# Patient Record
Sex: Female | Born: 1975 | Race: White | Hispanic: No | Smoking: Former smoker
Health system: Southern US, Community
[De-identification: ages and names within clinical notes are randomized; demographics above are authoritative.]

## PROBLEM LIST (undated history)

## (undated) DIAGNOSIS — K5900 Constipation, unspecified: Secondary | ICD-10-CM

## (undated) DIAGNOSIS — M1990 Unspecified osteoarthritis, unspecified site: Secondary | ICD-10-CM

## (undated) DIAGNOSIS — I10 Essential (primary) hypertension: Secondary | ICD-10-CM

## (undated) DIAGNOSIS — N946 Dysmenorrhea, unspecified: Secondary | ICD-10-CM

## (undated) DIAGNOSIS — R350 Frequency of micturition: Secondary | ICD-10-CM

## (undated) DIAGNOSIS — R8789 Other abnormal findings in specimens from female genital organs: Principal | ICD-10-CM

## (undated) DIAGNOSIS — A630 Anogenital (venereal) warts: Secondary | ICD-10-CM

## (undated) DIAGNOSIS — N920 Excessive and frequent menstruation with regular cycle: Secondary | ICD-10-CM

## (undated) DIAGNOSIS — L739 Follicular disorder, unspecified: Secondary | ICD-10-CM

## (undated) DIAGNOSIS — N760 Acute vaginitis: Secondary | ICD-10-CM

## (undated) DIAGNOSIS — G43109 Migraine with aura, not intractable, without status migrainosus: Secondary | ICD-10-CM

## (undated) DIAGNOSIS — G43909 Migraine, unspecified, not intractable, without status migrainosus: Secondary | ICD-10-CM

## (undated) DIAGNOSIS — B9689 Other specified bacterial agents as the cause of diseases classified elsewhere: Secondary | ICD-10-CM

## (undated) DIAGNOSIS — N898 Other specified noninflammatory disorders of vagina: Secondary | ICD-10-CM

## (undated) HISTORY — DX: Dysmenorrhea, unspecified: N94.6

## (undated) HISTORY — DX: Follicular disorder, unspecified: L73.9

## (undated) HISTORY — DX: Acute vaginitis: N76.0

## (undated) HISTORY — DX: Migraine with aura, not intractable, without status migrainosus: G43.109

## (undated) HISTORY — DX: Essential (primary) hypertension: I10

## (undated) HISTORY — DX: Excessive and frequent menstruation with regular cycle: N92.0

## (undated) HISTORY — PX: TUBAL LIGATION: SHX77

## (undated) HISTORY — DX: Migraine, unspecified, not intractable, without status migrainosus: G43.909

## (undated) HISTORY — DX: Other specified bacterial agents as the cause of diseases classified elsewhere: B96.89

## (undated) HISTORY — DX: Constipation, unspecified: K59.00

## (undated) HISTORY — DX: Other specified noninflammatory disorders of vagina: N89.8

## (undated) HISTORY — DX: Frequency of micturition: R35.0

## (undated) HISTORY — DX: Other abnormal findings in specimens from female genital organs: R87.89

## (undated) HISTORY — DX: Unspecified osteoarthritis, unspecified site: M19.90

## (undated) HISTORY — DX: Anogenital (venereal) warts: A63.0

---

## 2001-04-04 ENCOUNTER — Emergency Department (HOSPITAL_COMMUNITY): Admission: EM | Admit: 2001-04-04 | Discharge: 2001-04-05 | Payer: Self-pay | Admitting: *Deleted

## 2005-08-27 ENCOUNTER — Emergency Department (HOSPITAL_COMMUNITY): Admission: EM | Admit: 2005-08-27 | Discharge: 2005-08-27 | Payer: Self-pay | Admitting: Emergency Medicine

## 2005-09-02 ENCOUNTER — Ambulatory Visit (HOSPITAL_COMMUNITY): Admission: RE | Admit: 2005-09-02 | Discharge: 2005-09-02 | Payer: Self-pay | Admitting: Family Medicine

## 2010-08-14 ENCOUNTER — Encounter: Payer: Self-pay | Admitting: Family Medicine

## 2010-08-15 ENCOUNTER — Encounter: Payer: Self-pay | Admitting: Family Medicine

## 2014-09-11 ENCOUNTER — Ambulatory Visit (INDEPENDENT_AMBULATORY_CARE_PROVIDER_SITE_OTHER): Payer: Medicaid Other | Admitting: Adult Health

## 2014-09-11 ENCOUNTER — Encounter: Payer: Self-pay | Admitting: Adult Health

## 2014-09-11 ENCOUNTER — Other Ambulatory Visit (HOSPITAL_COMMUNITY)
Admission: RE | Admit: 2014-09-11 | Discharge: 2014-09-11 | Disposition: A | Discharge status: Home / Self Care | Payer: Medicaid Other | Source: Ambulatory Visit | Attending: Adult Health | Admitting: Adult Health

## 2014-09-11 VITALS — BP 112/80 | HR 80 | Ht 60.5 in | Wt 154.0 lb

## 2014-09-11 DIAGNOSIS — Z01419 Encounter for gynecological examination (general) (routine) without abnormal findings: Secondary | ICD-10-CM | POA: Insufficient documentation

## 2014-09-11 DIAGNOSIS — Z139 Encounter for screening, unspecified: Secondary | ICD-10-CM

## 2014-09-11 DIAGNOSIS — K5900 Constipation, unspecified: Secondary | ICD-10-CM | POA: Insufficient documentation

## 2014-09-11 DIAGNOSIS — B9689 Other specified bacterial agents as the cause of diseases classified elsewhere: Secondary | ICD-10-CM

## 2014-09-11 DIAGNOSIS — Z1212 Encounter for screening for malignant neoplasm of rectum: Secondary | ICD-10-CM

## 2014-09-11 DIAGNOSIS — N898 Other specified noninflammatory disorders of vagina: Secondary | ICD-10-CM | POA: Insufficient documentation

## 2014-09-11 DIAGNOSIS — Z0000 Encounter for general adult medical examination without abnormal findings: Secondary | ICD-10-CM

## 2014-09-11 DIAGNOSIS — Z1151 Encounter for screening for human papillomavirus (HPV): Secondary | ICD-10-CM | POA: Insufficient documentation

## 2014-09-11 DIAGNOSIS — N760 Acute vaginitis: Secondary | ICD-10-CM

## 2014-09-11 DIAGNOSIS — N920 Excessive and frequent menstruation with regular cycle: Secondary | ICD-10-CM

## 2014-09-11 DIAGNOSIS — N921 Excessive and frequent menstruation with irregular cycle: Secondary | ICD-10-CM

## 2014-09-11 DIAGNOSIS — N946 Dysmenorrhea, unspecified: Secondary | ICD-10-CM | POA: Insufficient documentation

## 2014-09-11 HISTORY — DX: Other specified bacterial agents as the cause of diseases classified elsewhere: B96.89

## 2014-09-11 HISTORY — DX: Excessive and frequent menstruation with regular cycle: N92.0

## 2014-09-11 HISTORY — DX: Other specified noninflammatory disorders of vagina: N89.8

## 2014-09-11 HISTORY — DX: Dysmenorrhea, unspecified: N94.6

## 2014-09-11 HISTORY — DX: Other specified bacterial agents as the cause of diseases classified elsewhere: N76.0

## 2014-09-11 HISTORY — DX: Constipation, unspecified: K59.00

## 2014-09-11 LAB — HEMOCCULT GUIAC POC 1CARD (OFFICE): Fecal Occult Blood, POC: NEGATIVE

## 2014-09-11 LAB — POCT WET PREP (WET MOUNT)

## 2014-09-11 MED ORDER — METRONIDAZOLE 500 MG PO TABS: 500.0000 mg | ORAL_TABLET | Freq: Two times a day (BID) | ORAL | Status: DC

## 2014-09-11 NOTE — Progress Notes (Signed)
Patient ID: Lindsey Andrade, female   DOB: 12/11/1975, 39 y.o.   MRN: [id number] History of Present Illness: Lindsey Andrade is a 39 year old white female, married, in for well woman gyn exam and pap.She is complaining of vaginal discharge with odor, pain with sex and heavy periods with cramps.Periods are heavy for 2 days with clots then can spot for 7-8 days.Has migraines with aura around period time.Also complains of constipation, may not go for a week.   Current Medications, Allergies, Past Medical History, Past Surgical History, Family History and Social History were reviewed in [facility] electronic medical record.     Review of Systems: Patient denies any  hearing loss, fatigue, blurred vision, shortness of breath, chest pain, abdominal pain, problems with  Urination.  No joint pain or mood swings.See HPI for positives.    Physical Exam:BP 112/80 mmHg  Pulse 80  Ht 5' 0.5" (1.537 m)  Wt 154 lb (69.854 kg)  BMI 29.57 kg/m2  LMP 08/13/2014 General:  Well developed, well nourished, no acute distress Skin:  Warm and dry Neck:  Midline trachea, normal thyroid, good ROM, no lymphadenopathy Lungs; Clear to auscultation bilaterally Breast:  No dominant palpable mass, retraction, or nipple discharge Cardiovascular: Regular rate and rhythm Abdomen:  Soft, non tender, no hepatosplenomegaly Pelvic:  External genitalia is normal in appearance, no lesions.  The vagina is normal in appearance, but has white frothy discharge with odor. Urethra has no lesions or masses. The cervix is bulbous, pap with HPV performed, and when moves cervix is how it feels with sex.  Uterus is felt to be normal size, shape, and contour.  No adnexal masses or tenderness noted.Bladder is non tender, no masses felt. Wet Prep: +clue cells and WBCs. Rectal: Good sphincter tone, no polyps, or hemorrhoids felt.  Hemoccult negative. Extremities/musculoskeletal:  No swelling or varicosities noted, no clubbing or cyanosis Psych:   No mood changes, alert and cooperative,seems happy Discussed lets get labs and US then talk about options, like ablation.  Impression: Well woman gyn exam with pap Menorrhagia Vaginal discharge BV Dysmenorrhea  Constipation    Plan: Return in 1 week for gyn US and 1-2 days later with me Mamm[facility] scheduled for her [facility]24 at [facility] Check CBC,CMP,TSH and lipids Try mira lax daily Rx flagyl 500 mg 1 bid x 7 days, no alcohol, review handout on BV   Review handout on menorrhagia, constipation and ablation

## 2014-09-11 NOTE — Patient Instructions (Addendum)
Constipation  Constipation is when a person has fewer than three bowel movements a week, has difficulty having a bowel movement, or has stools that are dry, hard, or larger than normal. As people grow older, constipation is more common. If you try to fix constipation with medicines that make you have a bowel movement (laxatives), the problem may get worse. Long-term laxative use may cause the muscles of the colon to become weak. A low-fiber diet, not taking in enough fluids, and taking certain medicines may make constipation worse.   CAUSES   · Certain medicines, such as antidepressants, pain medicine, iron supplements, antacids, and water pills.    · Certain diseases, such as diabetes, irritable bowel syndrome (IBS), thyroid disease, or depression.    · Not drinking enough water.    · Not eating enough fiber-rich foods.    · Stress or travel.    · Lack of physical activity or exercise.    · Ignoring the urge to have a bowel movement.    · Using laxatives too much.    SIGNS AND SYMPTOMS   · Having fewer than three bowel movements a week.    · Straining to have a bowel movement.    · Having stools that are hard, dry, or larger than normal.    · Feeling full or bloated.    · Pain in the lower abdomen.    · Not feeling relief after having a bowel movement.    DIAGNOSIS   Your health care provider will take a medical history and perform a physical exam. Further testing may be done for severe constipation. Some tests may include:  · A barium enema X-ray to examine your rectum, colon, and, sometimes, your small intestine.    · A sigmoidoscopy to examine your lower colon.    · A colonoscopy to examine your entire colon.  TREATMENT   Treatment will depend on the severity of your constipation and what is causing it. Some dietary treatments include drinking more fluids and eating more fiber-rich foods. Lifestyle treatments may include regular exercise. If these diet and lifestyle recommendations do not help, your health care  provider may recommend taking over-the-counter laxative medicines to help you have bowel movements. Prescription medicines may be prescribed if over-the-counter medicines do not work.   HOME CARE INSTRUCTIONS   · Eat foods that have a lot of fiber, such as fruits, vegetables, whole grains, and beans.  · Limit foods high in fat and processed sugars, such as french fries, hamburgers, cookies, candies, and soda.    · A fiber supplement may be added to your diet if you cannot get enough fiber from foods.    · Drink enough fluids to keep your urine clear or pale yellow.    · Exercise regularly or as directed by your health care provider.    · Go to the restroom when you have the urge to go. Do not hold it.    · Only take over-the-counter or prescription medicines as directed by your health care provider. Do not take other medicines for constipation without talking to your health care provider first.    SEEK IMMEDIATE MEDICAL CARE IF:   · You have bright red blood in your stool.    · Your constipation lasts for more than 4 days or gets worse.    · You have abdominal or rectal pain.    · You have thin, pencil-like stools.    · You have unexplained weight loss.  MAKE SURE YOU:   · Understand these instructions.  · Will watch your condition.  · Will get help right away if you are not   you have with your health care provider. Menorrhagia Menorrhagia is the medical term for when your menstrual periods are heavy or last longer than usual. With menorrhagia, every period you have may cause enough blood loss and cramping that you are unable to maintain your usual activities. CAUSES  In some cases, the cause of heavy periods is unknown,  but a number of conditions may cause menorrhagia. Common causes include:  A problem with the hormone-producing thyroid gland (hypothyroid).  Noncancerous growths in the uterus (polyps or fibroids).  An imbalance of the estrogen and progesterone hormones.  One of your ovaries not releasing an egg during one or more months.  Side effects of having an intrauterine device (IUD).  Side effects of some medicines, such as anti-inflammatory medicines or blood thinners.  A bleeding disorder that stops your blood from clotting normally. SIGNS AND SYMPTOMS  During a normal period, bleeding lasts between 4 and 8 days. Signs that your periods are too heavy include:  You routinely have to change your pad or tampon every 1 or 2 hours because it is completely soaked.  You pass blood clots larger than 1 inch (2.5 cm) in size.  You have bleeding for more than 7 days.  You need to use pads and tampons at the same time because of heavy bleeding.  You need to wake up to change your pads or tampons during the night.  You have symptoms of anemia, such as tiredness, fatigue, or shortness of breath. DIAGNOSIS  Your health care provider will perform a physical exam and ask you questions about your symptoms and menstrual history. Other tests may be ordered based on what the health care provider finds during the exam. These tests can include:  Blood tests. Blood tests are used to check if you are pregnant or have hormonal changes, a bleeding or thyroid disorder, low iron levels (anemia), or other problems.  Endometrial biopsy. Your health care provider takes a sample of tissue from the inside of your uterus to be examined under a microscope.  Pelvic ultrasound. This test uses sound waves to make a picture of your uterus, ovaries, and vagina. The pictures can show if you have fibroids or other growths.  Hysteroscopy. For this test, your health care provider will use a small telescope to look inside your  uterus. Based on the results of your initial tests, your health care provider may recommend further testing. TREATMENT  Treatment may not be needed. If it is needed, your health care provider may recommend treatment with one or more medicines first. If these do not reduce bleeding enough, a surgical treatment might be an option. The best treatment for you will depend on:   Whether you need to prevent pregnancy.  Your desire to have children in the future.  The cause and severity of your bleeding.  Your opinion and personal preference.  Medicines for menorrhagia may include:  Birth control methods that use hormones. These include the pill, skin patch, vaginal ring, shots that you get every 3 months, hormonal IUD, and implant. These treatments reduce bleeding during your menstrual period.  Medicines that thicken blood and slow bleeding.  Medicines that reduce swelling, such as ibuprofen.  Medicines that contain a synthetic hormone called progestin.   Medicines that make the ovaries stop working for a short time.  You may need surgical treatment for menorrhagia if the medicines are unsuccessful. Treatment options include:  Dilation and curettage (D&C). In this procedure, your health care provider opens (dilates) your  cervix and then scrapes or suctions tissue from the lining of your uterus to reduce menstrual bleeding.  Operative hysteroscopy. This procedure uses a tiny tube with a light (hysteroscope) to view your uterine cavity and can help in the surgical removal of a polyp that may be causing heavy periods.  Endometrial ablation. Through various techniques, your health care provider permanently destroys the entire lining of your uterus (endometrium). After endometrial ablation, most women have little or no menstrual flow. Endometrial ablation reduces your ability to become pregnant.  Endometrial resection. This surgical procedure uses an electrosurgical wire loop to remove the  lining of the uterus. This procedure also reduces your ability to become pregnant.  Hysterectomy. Surgical removal of the uterus and cervix is a permanent procedure that stops menstrual periods. Pregnancy is not possible after a hysterectomy. This procedure requires anesthesia and hospitalization. HOME CARE INSTRUCTIONS   Only take over-the-counter or prescription medicines as directed by your health care provider. Take prescribed medicines exactly as directed. Do not change or switch medicines without consulting your health care provider.  Take any prescribed iron pills exactly as directed by your health care provider. Long-term heavy bleeding may result in low iron levels. Iron pills help replace the iron your body lost from heavy bleeding. Iron may cause constipation. If this becomes a problem, increase the bran, fruits, and roughage in your diet.  Do not take aspirin or medicines that contain aspirin 1 week before or during your menstrual period. Aspirin may make the bleeding worse.  If you need to change your sanitary pad or tampon more than once every 2 hours, stay in bed and rest as much as possible until the bleeding stops.  Eat well-balanced meals. Eat foods high in iron. Examples are leafy green vegetables, meat, liver, eggs, and whole grain breads and cereals. Do not try to lose weight until the abnormal bleeding has stopped and your blood iron level is back to normal. SEEK MEDICAL CARE IF:   You soak through a pad or tampon every 1 or 2 hours, and this happens every time you have a period.  You need to use pads and tampons at the same time because you are bleeding so much.  You need to change your pad or tampon during the night.  You have a period that lasts for more than 8 days.  You pass clots bigger than 1 inch wide.  You have irregular periods that happen more or less often than once a month.  You feel dizzy or faint.  You feel very weak or tired.  You feel short of  breath or feel your heart is beating too fast when you exercise.  You have nausea and vomiting or diarrhea while you are taking your medicine.  You have any problems that may be related to the medicine you are taking. SEEK IMMEDIATE MEDICAL CARE IF:   You soak through 4 or more pads or tampons in 2 hours.  You have any bleeding while you are pregnant. MAKE SURE YOU:   Understand these instructions.  Will watch your condition.  Will get help right away if you are not doing well or get worse. Document Released: 07/11/2005 Document Revised: 07/16/2013 Document Reviewed: 12/30/2012 [facility] Patient Information 2015 [employer], [place]. This information is not intended to replace advice given to you by your health care provider. Make sure you discuss any questions you have with your health care provider. Bacterial Vaginosis Bacterial vaginosis is a vaginal infection that occurs when the normal  balance of bacteria in the vagina is disrupted. It results from an overgrowth of certain bacteria. This is the most common vaginal infection in women of childbearing age. Treatment is important to prevent complications, especially in pregnant women, as it can cause a premature delivery. CAUSES  Bacterial vaginosis is caused by an increase in harmful bacteria that are normally present in smaller amounts in the vagina. Several different kinds of bacteria can cause bacterial vaginosis. However, the reason that the condition develops is not fully understood. RISK FACTORS Certain activities or behaviors can put you at an increased risk of developing bacterial vaginosis, including:  Having a new sex partner or multiple sex partners.  Douching.  Using an intrauterine device (IUD) for contraception. Women do not get bacterial vaginosis from toilet seats, bedding, swimming pools, or contact with objects around them. SIGNS AND SYMPTOMS  Some women with bacterial vaginosis have no signs or symptoms. Common  symptoms include:  Grey vaginal discharge.  A fishlike odor with discharge, especially after sexual intercourse.  Itching or burning of the vagina and vulva.  Burning or pain with urination. DIAGNOSIS  Your health care provider will take a medical history and examine the vagina for signs of bacterial vaginosis. A sample of vaginal fluid may be taken. Your health care provider will look at this sample under a microscope to check for bacteria and abnormal cells. A vaginal pH test may also be done.  TREATMENT  Bacterial vaginosis may be treated with antibiotic medicines. These may be given in the form of a pill or a vaginal cream. A second round of antibiotics may be prescribed if the condition comes back after treatment.  HOME CARE INSTRUCTIONS   Only take over-the-counter or prescription medicines as directed by your health care provider.  If antibiotic medicine was prescribed, take it as directed. Make sure you finish it even if you start to feel better.  Do not have sex until treatment is completed.  Tell all sexual partners that you have a vaginal infection. They should see their health care provider and be treated if they have problems, such as a mild rash or itching.  Practice safe sex by using condoms and only having one sex partner. SEEK MEDICAL CARE IF:   Your symptoms are not improving after 3 days of treatment.  You have increased discharge or pain.  You have a fever. MAKE SURE YOU:   Understand these instructions.  Will watch your condition.  Will get help right away if you are not doing well or get worse. FOR MORE INFORMATION  Centers for Disease Control and Prevention, Division of STD Prevention: [employer] American Sexual Health Association (ASHA): www.ashastd.org  Document Released: 07/11/2005 Document Revised: 05/01/2013 Document Reviewed: 02/20/2013 [facility] Patient Information 2015 [place], [place]. This information is not intended to replace advice  given to you by your health care provider. Make sure you discuss any questions you have with your health care provider. Try [facility] Get [place] in 1 week Get mammogram 2.24 at 7:30 be there at 7:15  Physical in 1 year

## 2014-09-12 LAB — COMPREHENSIVE METABOLIC PANEL
A/G RATIO: 1.3 (ref 1.1–2.5)
ALK PHOS: 54 IU/L (ref 39–117)
ALT: 13 IU/L (ref 0–32)
AST: 18 IU/L (ref 0–40)
Albumin: 4.4 g/dL (ref 3.5–5.5)
BUN / CREAT RATIO: 13 (ref 9–23)
BUN: 9 mg/dL (ref 6–24)
Bilirubin Total: 0.4 mg/dL (ref 0.0–1.2)
CO2: 26 mmol/L (ref 18–29)
Calcium: 9.9 mg/dL (ref 8.7–10.2)
Chloride: 96 mmol/L — ABNORMAL LOW (ref 97–108)
Creatinine, Ser: 0.71 mg/dL (ref 0.57–1.00)
GFR, EST AFRICAN AMERICAN: 123 mL/min/{1.73_m2} (ref >59)
GFR, EST NON AFRICAN AMERICAN: 107 mL/min/{1.73_m2} (ref >59)
Globulin, Total: 3.4 g/dL (ref 1.5–4.5)
Glucose: 99 mg/dL (ref 65–99)
POTASSIUM: 3.4 mmol/L — AB (ref 3.5–5.2)
SODIUM: 138 mmol/L (ref 134–144)
TOTAL PROTEIN: 7.8 g/dL (ref 6.0–8.5)

## 2014-09-12 LAB — CBC
HEMATOCRIT: 42.6 % (ref 34.0–46.6)
HEMOGLOBIN: 14.8 g/dL (ref 11.1–15.9)
MCH: 29.5 pg (ref 26.6–33.0)
MCHC: 34.7 g/dL (ref 31.5–35.7)
MCV: 85 fL (ref 79–97)
Platelets: 411 10*3/uL — ABNORMAL HIGH (ref 150–379)
RBC: 5.02 x10E6/uL (ref 3.77–5.28)
RDW: 13.3 % (ref 12.3–15.4)
WBC: 10 10*3/uL (ref 3.4–10.8)

## 2014-09-12 LAB — LIPID PANEL
CHOL/HDL RATIO: 3 ratio (ref 0.0–4.4)
Cholesterol, Total: 114 mg/dL (ref 100–199)
HDL: 38 mg/dL — AB (ref >39)
LDL Calculated: 39 mg/dL (ref 0–99)
TRIGLYCERIDES: 186 mg/dL — AB (ref 0–149)
VLDL Cholesterol Cal: 37 mg/dL (ref 5–40)

## 2014-09-12 LAB — TSH: TSH: 0.793 u[IU]/mL (ref 0.450–4.500)

## 2014-09-15 ENCOUNTER — Telehealth: Payer: Self-pay | Admitting: Adult Health

## 2014-09-15 LAB — CYTOLOGY - PAP

## 2014-09-15 NOTE — Telephone Encounter (Signed)
Number no longer in service

## 2014-09-16 ENCOUNTER — Ambulatory Visit (HOSPITAL_COMMUNITY)
Admission: RE | Admit: 2014-09-16 | Discharge: 2014-09-16 | Disposition: A | Discharge status: Home / Self Care | Payer: Medicaid Other | Source: Ambulatory Visit | Attending: Family Medicine | Admitting: Family Medicine

## 2014-09-16 ENCOUNTER — Other Ambulatory Visit (HOSPITAL_COMMUNITY): Payer: Self-pay | Admitting: Family Medicine

## 2014-09-16 DIAGNOSIS — M25511 Pain in right shoulder: Secondary | ICD-10-CM

## 2014-09-17 ENCOUNTER — Ambulatory Visit (HOSPITAL_COMMUNITY): Admission: RE | Admit: 2014-09-17 | Payer: Medicaid Other | Source: Ambulatory Visit

## 2014-09-19 ENCOUNTER — Other Ambulatory Visit: Payer: Self-pay | Admitting: Adult Health

## 2014-09-19 ENCOUNTER — Ambulatory Visit (INDEPENDENT_AMBULATORY_CARE_PROVIDER_SITE_OTHER): Payer: Medicaid Other

## 2014-09-19 DIAGNOSIS — N921 Excessive and frequent menstruation with irregular cycle: Secondary | ICD-10-CM

## 2014-09-19 DIAGNOSIS — IMO0002 Reserved for concepts with insufficient information to code with codable children: Secondary | ICD-10-CM

## 2014-09-19 DIAGNOSIS — N941 Dyspareunia: Secondary | ICD-10-CM | POA: Diagnosis not present

## 2014-09-24 ENCOUNTER — Ambulatory Visit (HOSPITAL_COMMUNITY)
Admission: RE | Admit: 2014-09-24 | Discharge: 2014-09-24 | Disposition: A | Discharge status: Home / Self Care | Payer: Medicaid Other | Source: Ambulatory Visit | Attending: Adult Health | Admitting: Adult Health

## 2014-09-24 ENCOUNTER — Ambulatory Visit (INDEPENDENT_AMBULATORY_CARE_PROVIDER_SITE_OTHER): Payer: Medicaid Other | Admitting: Adult Health

## 2014-09-24 ENCOUNTER — Encounter: Payer: Self-pay | Admitting: Adult Health

## 2014-09-24 VITALS — BP 120/78 | HR 80 | Ht 61.0 in | Wt 158.5 lb

## 2014-09-24 DIAGNOSIS — G43909 Migraine, unspecified, not intractable, without status migrainosus: Secondary | ICD-10-CM

## 2014-09-24 DIAGNOSIS — N921 Excessive and frequent menstruation with irregular cycle: Secondary | ICD-10-CM | POA: Diagnosis not present

## 2014-09-24 DIAGNOSIS — Z1231 Encounter for screening mammogram for malignant neoplasm of breast: Secondary | ICD-10-CM | POA: Diagnosis not present

## 2014-09-24 DIAGNOSIS — N946 Dysmenorrhea, unspecified: Secondary | ICD-10-CM

## 2014-09-24 DIAGNOSIS — Z139 Encounter for screening, unspecified: Secondary | ICD-10-CM

## 2014-09-24 DIAGNOSIS — G43109 Migraine with aura, not intractable, without status migrainosus: Secondary | ICD-10-CM

## 2014-09-24 HISTORY — DX: Migraine, unspecified, not intractable, without status migrainosus: G43.909

## 2014-09-24 MED ORDER — IBUPROFEN 800 MG PO TABS
800.0000 mg | ORAL_TABLET | Freq: Three times a day (TID) | ORAL | Status: DC | PRN
Start: 2014-09-24 — End: 2014-12-29

## 2014-09-24 NOTE — Addendum Note (Signed)
Addended by: Cyril MourningGRIFFIN, Artin Mceuen A on: 09/24/2014 03:00 PM   Modules accepted: Level of Service

## 2014-09-24 NOTE — Patient Instructions (Signed)
Endometrial Ablation Endometrial ablation removes the lining of the uterus (endometrium). It is usually a same-day, outpatient treatment. Ablation helps avoid major surgery, such as surgery to remove the cervix and uterus (hysterectomy). After endometrial ablation, you will have little or no menstrual bleeding and may not be able to have children. However, if you are premenopausal, you will need to use a reliable method of birth control following the procedure because of the small chance that pregnancy can occur. There are different reasons to have this procedure, which include:  Heavy periods.  Bleeding that is causing anemia.  Irregular bleeding.  Bleeding fibroids on the lining inside the uterus if they are smaller than 3 centimeters. This procedure should not be done if:  You want children in the future.  You have severe cramps with your menstrual period.  You have precancerous or cancerous cells in your uterus.  You were recently pregnant.  You have gone through menopause.  You have had major surgery on the uterus, such as a cesarean delivery. LET [facility] PROVID[facility] KNOW ABOUT:  Any allergies you have.  All medicines you are taking, including vitamins, herbs, eye drops, creams, and over-the-counter medicines.  Previous problems you or members of your family have had with the use of anesthetics.  Any blood disorders you have.  Previous surgeries you have had.  Medical conditions you have. RISKS AND COMPLICATIONS  Generally, this is a safe procedure. However, as with any procedure, complications can occur. Possible complications include:  Perforation of the uterus.  Bleeding.  Infection of the uterus, bladder, or vagina.  Injury to surrounding organs.  An air bubble to the lung (air embolus).  Pregnancy following the procedure.  Failure of the procedure to help the problem, requiring hysterectomy.  Decreased ability to diagnose cancer in the lining of  the uterus. BEFORE THE PROCEDURE  The lining of the uterus must be tested to make sure there is no pre-cancerous or cancer cells present.  An ultrasound may be performed to look at the size of the uterus and to check for abnormalities.  Medicines may be given to thin the lining of the uterus. PROCEDURE  During the procedure, your health care provider will use a tool called a resectoscope to help see inside your uterus. There are different ways to remove the lining of your uterus.   Radiofrequency - This method uses a radiofrequency-alternating electric current to remove the lining of the uterus.  Cryotherapy - This method uses extreme cold to freeze the lining of the uterus.  Heated-Free Liquid - This method uses heated salt (saline) solution to remove the lining of the uterus.  Microwave - This method uses high-energy microwaves to heat up the lining of the uterus to remove it.  Thermal balloon - This method involves inserting a catheter with a balloon tip into the uterus. The balloon tip is filled with heated fluid to remove the lining of the uterus. AFTER THE PROCEDURE  After your procedure, do not have sexual intercourse or insert anything into your vagina until permitted by your health care provider. After the procedure, you may experience:  Cramps.  Vaginal discharge.  Frequent urination. Document Released: 05/20/2004 Document Revised: 03/13/2013 Document Reviewed: 12/12/2012 [facility] Patient Information 2015 [place], [place]. This information is not intended to replace advice given to you by your health care provider. Make sure you discuss any questions you have with your health care provider. Return in 1 week for pre op with dr Despina HiddenEure

## 2014-09-24 NOTE — Progress Notes (Signed)
Subjective:     Patient ID: Lindsey Andrade, female   DOB: 03/06/1976, 39 y.o.   MRN: [id number]  HPI [name] is a 39 year old white female in to discuss treatment options for menorrhagia and dysmenorrhea.She had mammogram this am and complains of migraine now over left eye.She was treated for BV and she says discharge is better, has 2 more pills to take.  Review of Systems +dysmenorrhea +menorrhagia +headache All other systems negative Reviewed past medical,surgical, social and family history. Reviewed medications and allergies.     Objective:   Physical Exam BP 120/78 mmHg  Pulse 80  Ht 5\' 1"  (1.549 m)  Wt 158 lb 8 oz (71.895 kg)  BMI 29.96 kg/m2  LMP 09/20/2014 BP 120/78 mmHg  Pulse 80  Ht 5\' 1"  (1.549 m)  Wt 158 lb 8 oz (71.895 kg)  BMI 29.96 kg/m2  LMP 09/20/2014 Skin warm and dry, pressure over left eye, reviewed labs and US with pt, uterus slightly enlarged other wise normal US, wants to get ablation, will schedule with Dr Despina HiddenEure, and will Rx motrin for headache.Pap was reviewed and was normal with negative HPV.    Assessment:     Menorrhagia Dysmenorrhea Migraine     Plan:     Rx motrin 800 mg #60 1 every 8 hours prn pain with 1 refill   Review handout on ablation  Return in  1 week for pre op with Dr Despina HiddenEure for endo ablation

## 2014-10-01 ENCOUNTER — Encounter: Payer: Medicaid Other | Admitting: Obstetrics & Gynecology

## 2014-10-03 ENCOUNTER — Encounter: Payer: Self-pay | Admitting: Obstetrics & Gynecology

## 2014-10-03 ENCOUNTER — Ambulatory Visit (INDEPENDENT_AMBULATORY_CARE_PROVIDER_SITE_OTHER): Payer: Medicaid Other | Admitting: Obstetrics & Gynecology

## 2014-10-03 VITALS — BP 114/80 | HR 60 | Ht 61.0 in | Wt 156.5 lb

## 2014-10-03 DIAGNOSIS — N946 Dysmenorrhea, unspecified: Secondary | ICD-10-CM

## 2014-10-03 DIAGNOSIS — N941 Dyspareunia: Secondary | ICD-10-CM

## 2014-10-03 DIAGNOSIS — IMO0002 Reserved for concepts with insufficient information to code with codable children: Secondary | ICD-10-CM

## 2014-10-03 DIAGNOSIS — N921 Excessive and frequent menstruation with irregular cycle: Secondary | ICD-10-CM

## 2014-10-03 MED ORDER — MEGESTROL ACETATE 40 MG PO TABS
ORAL_TABLET | ORAL | Status: DC
Start: 2014-10-03 — End: 2014-10-15

## 2014-10-03 NOTE — Progress Notes (Signed)
Patient ID: Lindsey Andrade, female   DOB: 03/27/1976, 39 y.o.   MRN: [id number] Preoperative History and Physical  Lindsey Andrade is a 39 y.o. G2P2 with Patient's last menstrual period was 09/20/2014. admitted for a hysteroscopy uterine curettage endometrial ablation.  Pt does have some discomfort with intercourse does not interrupt and does not feel at this time is big enough of an issue to consider a hysterectomy due to bump dyspareunia  PMH:    Past Medical History  Diagnosis Date  . Hypertension   . Arthritis     bursitis  . Genital warts   . Menorrhagia 09/11/2014  . Vaginal discharge 09/11/2014  . BV (bacterial vaginosis) 09/11/2014  . Dysmenorrhea 09/11/2014  . Constipation 09/11/2014  . Migraine headache with aura   . Migraines 09/24/2014    PSH:     Past Surgical History  Procedure Laterality Date  . Cesarean section  1999    POb/GynH:      OB History    Gravida Para Term Preterm AB TAB SAB Ectopic Multiple Living   2 2        2       SH:   History  Substance Use Topics  . Smoking status: Former Smoker    Types: Cigarettes  . Smokeless tobacco: Never Used     Comment: quit 8-9 years ago  . Alcohol Use: No    FH:    Family History  Problem Relation Age of Onset  . Other Brother     has been sick x 15 years but no one knows what's wrong   . Cancer Maternal Aunt     breast  . Hypertension Maternal Grandmother   . Diabetes Maternal Grandmother   . Cancer Maternal Grandfather     lung  . Cancer Other     cervical; maternal side of family     Allergies:  Allergies  Allergen Reactions  . Keflex [Cephalexin] Anaphylaxis and Hives    Trouble breathing.  . Amoxicillin Other (See Comments)    All cillins- causes terrible yeast infection.    Medications:       Current outpatient prescriptions:  .  atenolol-chlorthalidone (TENORETIC) 50-25 MG per tablet, Take 1 tablet by mouth daily., Disp: , Rfl:  .  ibuprofen (ADVIL,MOTRIN) 800 MG tablet, Take 1  tablet (800 mg total) by mouth every 8 (eight) hours as needed., Disp: 60 tablet, Rfl: 1 .  metroNIDAZOLE (FLAGYL) 500 MG tablet, Take 1 tablet (500 mg total) by mouth 2 (two) times daily. (Patient not taking: Reported on 10/03/2014), Disp: 14 tablet, Rfl: 0  Review of Systems:   Review of Systems  Constitutional: Negative for fever, chills, weight loss, malaise/fatigue and diaphoresis.  HENT: Negative for hearing loss, ear pain, nosebleeds, congestion, sore throat, neck pain, tinnitus and ear discharge.   Eyes: Negative for blurred vision, double vision, photophobia, pain, discharge and redness.  Respiratory: Negative for cough, hemoptysis, sputum production, shortness of breath, wheezing and stridor.   Cardiovascular: Negative for chest pain, palpitations, orthopnea, claudication, leg swelling and PND.  Gastrointestinal: Positive for abdominal pain. Negative for heartburn, nausea, vomiting, diarrhea, constipation, blood in stool and melena.  Genitourinary: Negative for dysuria, urgency, frequency, hematuria and flank pain.  Musculoskeletal: Negative for myalgias, back pain, joint pain and falls.  Skin: Negative for itching and rash.  Neurological: Negative for dizziness, tingling, tremors, sensory change, speech change, focal weakness, seizures, loss of consciousness, weakness and headaches.  Endo/Heme/Allergies: Negative for environmental allergies and  polydipsia. Does not bruise/bleed easily.  Psychiatric/Behavioral: Negative for depression, suicidal ideas, hallucinations, memory loss and substance abuse. The patient is not nervous/anxious and does not have insomnia.      PHYSICAL EXAM:  Blood pressure 114/80, pulse 60, height  (1.549 m), weight 156 lb 8 oz (70.988 kg), last menstrual period 09/20/2014.    Vitals reviewed. Constitutional: She is oriented to person, place, and time. She appears well-developed and well-nourished.  HENT:  Head: Normocephalic and atraumatic.  Right  Ear: External ear normal.  Left Ear: External ear normal.  Nose: Nose normal.  Mouth/Throat: Oropharynx is clear and moist.  Eyes: Conjunctivae and EOM are normal. Pupils are equal, round, and reactive to light. Right eye exhibits no discharge. Left eye exhibits no discharge. No scleral icterus.  Neck: Normal range of motion. Neck supple. No tracheal deviation present. No thyromegaly present.  Cardiovascular: Normal rate, regular rhythm, normal heart sounds and intact distal pulses.  Exam reveals no gallop and no friction rub.   No murmur heard. Respiratory: Effort normal and breath sounds normal. No respiratory distress. She has no wheezes. She has no rales. She exhibits no tenderness.  GI: Soft. Bowel sounds are normal. She exhibits no distension and no mass. There is tenderness. There is no rebound and no guarding.  Genitourinary:       Vulva is normal without lesions Vagina is pink moist without discharge Cervix normal in appearance and pap is normal Uterus is minimally enlarged no endometrial pathology noted on sonogram Adnexa is negative with normal sized ovaries by sonogram  Musculoskeletal: Normal range of motion. She exhibits no edema and no tenderness.  Neurological: She is alert and oriented to person, place, and time. She has normal reflexes. She displays normal reflexes. No cranial nerve deficit. She exhibits normal muscle tone. Coordination normal.  Skin: Skin is warm and dry. No rash noted. No erythema. No pallor.  Psychiatric: She has a normal mood and affect. Her behavior is normal. Judgment and thought content normal.    Labs: No results found for this or any previous visit (from the past 336 hour(s)).  EKG: No orders found for this or any previous visit.  Imaging Studies: Dg Shoulder Right  09/17/2014   CLINICAL DATA:  Right shoulder pain for several years, worsening recently, no injury  EXAM: RIGHT SHOULDER - 2+ VIEW  COMPARISON:  None.  FINDINGS: The right humeral  head is in normal position and the glenohumeral joint space appears normal for age. The right [facility] joint is normally aligned. No acute abnormality is seen.  IMPRESSION: Negative.   Electronically Signed   By: Dwyane Dee M.D.   On: 09/17/2014 09:53   US Pelvis Complete  09/22/2014   GYNECOLOGIC SONOGRAM   Lindsey Andrade is a 39 y.o. G2P2 LMP January 2016  for a pelvic  sonogram for dyspareunia and menorrhagia.  Uterus                      Anteverted 10.4 x 6.0 x 4.1 cm,   Endometrium          8.2 mm, symmetrical,   Right ovary             2.1 x 2.0 x 1.6 cm,   Left ovary                2.6 x 2.3 x 1.8 cm,   No free fluid or adnexal masses noted within the pelvis  Technician Comments:  Anteverted uterus, ENdom-8.92mm, bilateral adnexa/ovaries appear WNL, no  free fluid or adnexal masses noted within the pelvis   Chari Manning 09/19/2014 11:19 AM Clinical Impression and recommendations:  I have reviewed the sonogram results above.  Combined with the patient's current clinical course, below are my  impressions and any appropriate recommendations for management based on  the sonographic findings:  1. Mildly enlarged uterus , 135 gm, anteverted, slight retroflexion. 2. Trilaminar endometrial stripe, consistent with proliferative  endometrium 3. Normal adnexal structures.  FERGUSON,JOHN V    Mm Digital Screening Bilateral  09/24/2014   CLINICAL DATA:  Screening.  EXAM: DIGITAL SCREENING BILATERAL MAMMOGRAM WITH CAD  COMPARISON:  None.  ACR Breast Density Category b: There are scattered areas of fibroglandular density.  FINDINGS: There are no findings suspicious for malignancy. Images were processed with CAD.  IMPRESSION: No mammographic evidence of malignancy. A result letter of this screening mammogram will be mailed directly to the patient.  RECOMMENDATION: Screening mammogram in one year. (Code:SM-B-01Y)  BI-RADS CATEGORY  1: Negative.   Electronically Signed   By: Beckie Salts M.D.   On: 09/24/2014 16:22       Assessment: Patient Active Problem List   Diagnosis Date Noted  . Migraines 09/24/2014  . Menorrhagia 09/11/2014  . Vaginal discharge 09/11/2014  . BV (bacterial vaginosis) 09/11/2014  . Dysmenorrhea 09/11/2014  . Constipation 09/11/2014    Plan: Hysteroscopy uterine curettage endometrial ablation  Arin Peral H 10/03/2014 11:56 AM

## 2014-10-09 NOTE — Patient Instructions (Signed)
Arline Asplicia D Battey  10/09/2014   Your procedure is scheduled on:  10/15/2014 [facility] at  930  AM.  Call this number if you have problems the morning of surgery: [phone]   Remember:   Do not eat food or drink liquids after midnight.   Take these medicines the morning of surgery with A SIP OF WATER:  atenolol   Do not wear jewelry, make-up or nail polish.  Do not wear lotions, powders, or perfumes.   Do not shave 48 hours prior to surgery. Men may shave face and neck.  Do not bring valuables to the hospital.  [facility] is not responsible  for any belongings or valuables.               Contacts, dentures or bridgework may not be worn into surgery.  Leave suitcase in the car. After surgery it may be brought to your room.  For patients admitted to the hospital, discharge time is determined by your treatment team.               Patients discharged the day of surgery will not be allowed to drive home.  Name and phone number of your driver: family  Special Instructions: Shower using CHG 2 nights before surgery and the night before surgery.  If you shower the day of surgery use CHG.  Use special wash - you have one bottle of CHG for all showers.  You should use approximately 1/3 of the bottle for each shower.   Please read over the following fact sheets that you were given: Pain Booklet, Coughing and Deep Breathing, Surgical Site Infection Prevention, Anesthesia Post-op Instructions and Care and Recovery After Surgery Hysteroscopy Hysteroscopy is a procedure used for looking inside the womb (uterus). It may be done for various reasons, including:  To evaluate abnormal bleeding, fibroid (benign, noncancerous) tumors, polyps, scar tissue (adhesions), and possibly cancer of the uterus.  To look for lumps (tumors) and other uterine growths.  To look for causes of why a woman cannot get pregnant (infertility), causes of recurrent loss of pregnancy (miscarriages), or  a lost intrauterine device (IUD).  To perform a sterilization by blocking the fallopian tubes from inside the uterus. In this procedure, a thin, flexible tube with a tiny light and camera on the end of it (hysteroscope) is used to look inside the uterus. A hysteroscopy should be done right after a menstrual period to be sure you are not pregnant. LET [facility]ARE PROVIDER KNOW ABOUT:   Any allergies you have.  All medicines you are taking, including vitamins, herbs, eye drops, creams, and over-the-counter medicines.  Previous problems you or members of your family have had with the use of anesthetics.  Any blood disorders you have.  Previous surgeries you have had.  Medical conditions you have. RISKS AND COMPLICATIONS  Generally, this is a safe procedure. However, as with any procedure, complications can occur. Possible complications include:  Putting a hole in the uterus.  Excessive bleeding.  Infection.  Damage to the cervix.  Injury to other organs.  Allergic reaction to medicines.  Too much fluid used in the uterus for the procedure. BEFORE THE PROCEDURE   Ask your health care provider about changing or stopping any regular medicines.  Do not take aspirin or blood thinners for 1 week before the procedure, or as directed by your health care provider. These can cause bleeding.  If you  smoke, do not smoke for 2 weeks before the procedure.  In some cases, a medicine is placed in the cervix the day before the procedure. This medicine makes the cervix have a larger opening (dilate). This makes it easier for the instrument to be inserted into the uterus during the procedure.  Do not eat or drink anything for at least 8 hours before the surgery.  Arrange for someone to take you home after the procedure. PROCEDURE   You may be given a medicine to relax you (sedative). You may also be given one of the following:  A medicine that numbs the area around the cervix (local  anesthetic).  A medicine that makes you sleep through the procedure (general anesthetic).  The hysteroscope is inserted through the vagina into the uterus. The camera on the hysteroscope sends a picture to a TV screen. This gives the surgeon a good view inside the uterus.  During the procedure, air or a liquid is put into the uterus, which allows the surgeon to see better.  Sometimes, tissue is gently scraped from inside the uterus. These tissue samples are sent to a lab for testing. AFTER THE PROCEDURE   If you had a general anesthetic, you may be groggy for a couple hours after the procedure.  If you had a local anesthetic, you will be able to go home as soon as you are stable and feel ready.  You may have some cramping. This normally lasts for a couple days.  You may have bleeding, which varies from light spotting for a few days to menstrual-like bleeding for 3-7 days. This is normal.  If your test results are not back during the visit, make an appointment with your health care provider to find out the results. Document Released: 10/17/2000 Document Revised: 05/01/2013 Document Reviewed: 02/07/2013 [facility] Patient Information 2015 [place], [place]. This information is not intended to replace advice given to you by your health care provider. Make sure you discuss any questions you have with your health care provider. Endometrial Ablation Endometrial ablation removes the lining of the uterus (endometrium). It is usually a same-day, outpatient treatment. Ablation helps avoid major surgery, such as surgery to remove the cervix and uterus (hysterectomy). After endometrial ablation, you will have little or no menstrual bleeding and may not be able to have children. However, if you are premenopausal, you will need to use a reliable method of birth control following the procedure because of the small chance that pregnancy can occur. There are different reasons to have this procedure, which  include:  Heavy periods.  Bleeding that is causing anemia.  Irregular bleeding.  Bleeding fibroids on the lining inside the uterus if they are smaller than 3 centimeters. This procedure should not be done if:  You want children in the future.  You have severe cramps with your menstrual period.  You have precancerous or cancerous cells in your uterus.  You were recently pregnant.  You have gone through menopause.  You have had major surgery on the uterus, such as a cesarean delivery. LET [facility] CARE PROVIDER KNOW ABOUT:  Any allergies you have.  All medicines you are taking, including vitamins, herbs, eye drops, creams, and over-the-counter medicines.  Previous problems you or members of your family have had with the use of anesthetics.  Any blood disorders you have.  Previous surgeries you have had.  Medical conditions you have. RISKS AND COMPLICATIONS  Generally, this is a safe procedure. However, as with any procedure, complications  can occur. Possible complications include:  Perforation of the uterus.  Bleeding.  Infection of the uterus, bladder, or vagina.  Injury to surrounding organs.  An air bubble to the lung (air embolus).  Pregnancy following the procedure.  Failure of the procedure to help the problem, requiring hysterectomy.  Decreased ability to diagnose cancer in the lining of the uterus. BEFORE THE PROCEDURE  The lining of the uterus must be tested to make sure there is no pre-cancerous or cancer cells present.  An ultrasound may be performed to look at the size of the uterus and to check for abnormalities.  Medicines may be given to thin the lining of the uterus. PROCEDURE  During the procedure, your health care provider will use a tool called a resectoscope to help see inside your uterus. There are different ways to remove the lining of your uterus.   Radiofrequency - This method uses a radiofrequency-alternating electric current to  remove the lining of the uterus.  Cryotherapy - This method uses extreme cold to freeze the lining of the uterus.  Heated-Free Liquid - This method uses heated salt (saline) solution to remove the lining of the uterus.  Microwave - This method uses high-energy microwaves to heat up the lining of the uterus to remove it.  Thermal balloon - This method involves inserting a catheter with a balloon tip into the uterus. The balloon tip is filled with heated fluid to remove the lining of the uterus. AFTER THE PROCEDURE  After your procedure, do not have sexual intercourse or insert anything into your vagina until permitted by your health care provider. After the procedure, you may experience:  Cramps.  Vaginal discharge.  Frequent urination. Document Released: 05/20/2004 Document Revised: 03/13/2013 Document Reviewed: 12/12/2012 [facility] Patient Information 2015 [place], [place]. This information is not intended to replace advice given to you by your health care provider. Make sure you discuss any questions you have with your health care provider. Dilation and Curettage or Vacuum Curettage Dilation and curettage (D&C) and vacuum curettage are minor procedures. A D&C involves stretching (dilation) the cervix and scraping (curettage) the inside lining of the womb (uterus). During a D&C, tissue is gently scraped from the inside lining of the uterus. During a vacuum curettage, the lining and tissue in the uterus are removed with the use of gentle suction.  Curettage may be performed to either diagnose or treat a problem. As a diagnostic procedure, curettage is performed to examine tissues from the uterus. A diagnostic curettage may be performed for the following symptoms:   Irregular bleeding in the uterus.   Bleeding with the development of clots.   Spotting between menstrual periods.   Prolonged menstrual periods.   Bleeding after menopause.   No menstrual period (amenorrhea).   A  change in size and shape of the uterus.  As a treatment procedure, curettage may be performed for the following reasons:   Removal of an IUD (intrauterine device).   Removal of retained placenta after giving birth. Retained placenta can cause an infection or bleeding severe enough to require transfusions.   Abortion.   Miscarriage.   Removal of polyps inside the uterus.   Removal of uncommon types of noncancerous lumps (fibroids).  LET [facility] CARE PROVIDER KNOW ABOUT:   Any allergies you have.   All medicines you are taking, including vitamins, herbs, eye drops, creams, and over-the-counter medicines.   Previous problems you or members of your family have had with the use of anesthetics.  Any blood disorders you have.   Previous surgeries you have had.   Medical conditions you have. RISKS AND COMPLICATIONS  Generally, this is a safe procedure. However, as with any procedure, complications can occur. Possible complications include:  Excessive bleeding.   Infection of the uterus.   Damage to the cervix.   Development of scar tissue (adhesions) inside the uterus, later causing abnormal amounts of menstrual bleeding.   Complications from the general anesthetic, if a general anesthetic is used.   Putting a hole (perforation) in the uterus. This is rare.  BEFORE THE PROCEDURE   Eat and drink before the procedure only as directed by your health care provider.   Arrange for someone to take you home.  PROCEDURE  This procedure usually takes about 15-30 minutes.  You will be given one of the following:  A medicine that numbs the area in and around the cervix (local anesthetic).   A medicine to make you sleep through the procedure (general anesthetic).  You will lie on your back with your legs in stirrups.   A warm metal or plastic instrument (speculum) will be placed in your vagina to keep it open and to allow the health care provider to see  the cervix.  There are two ways in which your cervix can be softened and dilated. These include:   Taking a medicine.   Having thin rods (laminaria) inserted into your cervix.   A curved tool (curette) will be used to scrape cells from the inside lining of the uterus. In some cases, gentle suction is applied with the curette. The curette will then be removed.  AFTER THE PROCEDURE   You will rest in the recovery area until you are stable and are ready to go home.   You may feel sick to your stomach (nauseous) or throw up (vomit) if you were given a general anesthetic.   You may have a sore throat if a tube was placed in your throat during general anesthesia.   You may have light cramping and bleeding. This may last for 2 days to 2 weeks after the procedure.   Your uterus needs to make a new lining after the procedure. This may make your next period late. Document Released: 07/11/2005 Document Revised: 03/13/2013 Document Reviewed: 02/07/2013 [facility] Patient Information 2015 [place], [place]. This information is not intended to replace advice given to you by your health care provider. Make sure you discuss any questions you have with your health care provider. PATIENT INSTRUCTIONS POST-ANESTHESIA  IMMEDIATELY FOLLOWING SURGERY:  Do not drive or operate machinery for the first twenty four hours after surgery.  Do not make any important decisions for twenty four hours after surgery or while taking narcotic pain medications or sedatives.  If you develop intractable nausea and vomiting or a severe headache please notify your doctor immediately.  FOLLOW-UP:  Please make an appointment with your surgeon as instructed. You do not need to follow up with anesthesia unless specifically instructed to do so.  WOUND CARE INSTRUCTIONS (if applicable):  Keep a dry clean dressing on the anesthesia/puncture wound site if there is drainage.  Once the wound has quit draining you may leave it open to  air.  Generally you should leave the bandage intact for twenty four hours unless there is drainage.  If the epidural site drains for more than 36-48 hours please call the anesthesia department.  QUESTIONS?:  Please feel free to call your physician or the hospital operator if you have  any questions, and they will be happy to assist you.

## 2014-10-10 ENCOUNTER — Encounter (HOSPITAL_COMMUNITY): Payer: Self-pay

## 2014-10-10 ENCOUNTER — Other Ambulatory Visit: Payer: Self-pay | Admitting: Obstetrics & Gynecology

## 2014-10-10 ENCOUNTER — Encounter (HOSPITAL_COMMUNITY)
Admission: RE | Admit: 2014-10-10 | Discharge: 2014-10-10 | Disposition: A | Discharge status: Home / Self Care | Payer: Medicaid Other | Source: Ambulatory Visit | Attending: Obstetrics & Gynecology | Admitting: Obstetrics & Gynecology

## 2014-10-10 DIAGNOSIS — Z01818 Encounter for other preprocedural examination: Secondary | ICD-10-CM | POA: Diagnosis not present

## 2014-10-10 LAB — CBC
HCT: 42.6 % (ref 36.0–46.0)
HEMOGLOBIN: 14.6 g/dL (ref 12.0–15.0)
MCH: 29.8 pg (ref 26.0–34.0)
MCHC: 34.3 g/dL (ref 30.0–36.0)
MCV: 86.9 fL (ref 78.0–100.0)
Platelets: 353 10*3/uL (ref 150–400)
RBC: 4.9 MIL/uL (ref 3.87–5.11)
RDW: 13 % (ref 11.5–15.5)
WBC: 9.6 10*3/uL (ref 4.0–10.5)

## 2014-10-10 LAB — URINALYSIS, ROUTINE W REFLEX MICROSCOPIC
BILIRUBIN URINE: NEGATIVE
GLUCOSE, UA: NEGATIVE mg/dL
Hgb urine dipstick: NEGATIVE
KETONES UR: NEGATIVE mg/dL
NITRITE: NEGATIVE
Protein, ur: NEGATIVE mg/dL
Specific Gravity, Urine: 1.01 (ref 1.005–1.030)
Urobilinogen, UA: 0.2 mg/dL (ref 0.0–1.0)
pH: 7 (ref 5.0–8.0)

## 2014-10-10 LAB — COMPREHENSIVE METABOLIC PANEL
ALT: 17 U/L (ref 0–35)
AST: 22 U/L (ref 0–37)
Albumin: 4 g/dL (ref 3.5–5.2)
Alkaline Phosphatase: 43 U/L (ref 39–117)
Anion gap: 10 (ref 5–15)
BILIRUBIN TOTAL: 0.8 mg/dL (ref 0.3–1.2)
BUN: 15 mg/dL (ref 6–23)
CHLORIDE: 100 mmol/L (ref 96–112)
CO2: 26 mmol/L (ref 19–32)
CREATININE: 0.73 mg/dL (ref 0.50–1.10)
Calcium: 9.5 mg/dL (ref 8.4–10.5)
GFR calc Af Amer: >90 mL/min (ref >90)
GFR calc non Af Amer: >90 mL/min (ref >90)
Glucose, Bld: 96 mg/dL (ref 70–99)
POTASSIUM: 3.3 mmol/L — AB (ref 3.5–5.1)
SODIUM: 136 mmol/L (ref 135–145)
Total Protein: 7.6 g/dL (ref 6.0–8.3)

## 2014-10-10 LAB — HCG, SERUM, QUALITATIVE: Preg, Serum: NEGATIVE

## 2014-10-10 LAB — URINE MICROSCOPIC-ADD ON

## 2014-10-15 ENCOUNTER — Encounter (HOSPITAL_COMMUNITY)
Admission: RE | Disposition: A | Discharge status: Home / Self Care | Payer: Self-pay | Source: Ambulatory Visit | Attending: Obstetrics & Gynecology

## 2014-10-15 ENCOUNTER — Ambulatory Visit (HOSPITAL_COMMUNITY): Payer: Medicaid Other | Admitting: Anesthesiology

## 2014-10-15 ENCOUNTER — Ambulatory Visit (HOSPITAL_COMMUNITY)
Admission: RE | Admit: 2014-10-15 | Discharge: 2014-10-15 | Disposition: A | Discharge status: Home / Self Care | Payer: Medicaid Other | Source: Ambulatory Visit | Attending: Obstetrics & Gynecology | Admitting: Obstetrics & Gynecology

## 2014-10-15 ENCOUNTER — Other Ambulatory Visit: Payer: Self-pay | Admitting: Obstetrics & Gynecology

## 2014-10-15 ENCOUNTER — Encounter (HOSPITAL_COMMUNITY): Payer: Self-pay | Admitting: *Deleted

## 2014-10-15 DIAGNOSIS — N898 Other specified noninflammatory disorders of vagina: Secondary | ICD-10-CM | POA: Diagnosis not present

## 2014-10-15 DIAGNOSIS — I10 Essential (primary) hypertension: Secondary | ICD-10-CM | POA: Diagnosis not present

## 2014-10-15 DIAGNOSIS — Z880 Allergy status to penicillin: Secondary | ICD-10-CM | POA: Diagnosis not present

## 2014-10-15 DIAGNOSIS — N840 Polyp of corpus uteri: Secondary | ICD-10-CM | POA: Insufficient documentation

## 2014-10-15 DIAGNOSIS — G43909 Migraine, unspecified, not intractable, without status migrainosus: Secondary | ICD-10-CM | POA: Insufficient documentation

## 2014-10-15 DIAGNOSIS — N921 Excessive and frequent menstruation with irregular cycle: Secondary | ICD-10-CM | POA: Insufficient documentation

## 2014-10-15 DIAGNOSIS — N946 Dysmenorrhea, unspecified: Secondary | ICD-10-CM | POA: Diagnosis not present

## 2014-10-15 DIAGNOSIS — Z87891 Personal history of nicotine dependence: Secondary | ICD-10-CM | POA: Insufficient documentation

## 2014-10-15 HISTORY — PX: DILITATION & CURRETTAGE/HYSTROSCOPY WITH NOVASURE ABLATION: SHX5568

## 2014-10-15 SURGERY — DILATATION & CURETTAGE/HYSTEROSCOPY WITH NOVASURE ABLATION
Anesthesia: General | Site: Vagina

## 2014-10-15 MED ORDER — SODIUM CHLORIDE 0.9 % IR SOLN
Status: DC | PRN
  Administered 2014-10-15: 1000 mL

## 2014-10-15 MED ORDER — PROPOFOL 10 MG/ML IV BOLUS
INTRAVENOUS | Status: DC | PRN
  Administered 2014-10-15: 150 mg via INTRAVENOUS
  Administered 2014-10-15: 40 mg via INTRAVENOUS

## 2014-10-15 MED ORDER — CLINDAMYCIN PHOSPHATE 900 MG/50ML IV SOLN
900.0000 mg | INTRAVENOUS | Status: AC
  Administered 2014-10-15: 900 mg via INTRAVENOUS

## 2014-10-15 MED ORDER — LIDOCAINE HCL (PF) 1 % IJ SOLN
INTRAMUSCULAR | Status: AC
  Filled 2014-10-15: qty 5

## 2014-10-15 MED ORDER — LIDOCAINE HCL (CARDIAC) 10 MG/ML IV SOLN
INTRAVENOUS | Status: DC | PRN
  Administered 2014-10-15: 100 mg via INTRAVENOUS

## 2014-10-15 MED ORDER — DEXAMETHASONE SODIUM PHOSPHATE 4 MG/ML IJ SOLN
INTRAMUSCULAR | Status: DC | PRN
  Administered 2014-10-15: 8 mg via INTRAVENOUS

## 2014-10-15 MED ORDER — CIPROFLOXACIN IN D5W 400 MG/200ML IV SOLN
INTRAVENOUS | Status: AC
  Filled 2014-10-15: qty 200

## 2014-10-15 MED ORDER — PROPOFOL 10 MG/ML IV BOLUS
INTRAVENOUS | Status: AC
  Filled 2014-10-15: qty 20

## 2014-10-15 MED ORDER — CLINDAMYCIN PHOSPHATE 900 MG/50ML IV SOLN
INTRAVENOUS | Status: AC
  Filled 2014-10-15: qty 50

## 2014-10-15 MED ORDER — ONDANSETRON HCL 4 MG/2ML IJ SOLN: 4.0000 mg | Freq: Once | INTRAMUSCULAR | Status: DC | PRN

## 2014-10-15 MED ORDER — FENTANYL CITRATE 0.05 MG/ML IJ SOLN
25.0000 ug | INTRAMUSCULAR | Status: AC
  Administered 2014-10-15 (×2): 25 ug via INTRAVENOUS
  Filled 2014-10-15: qty 2

## 2014-10-15 MED ORDER — HYDROCODONE-ACETAMINOPHEN 5-325 MG PO TABS: 1.0000 | ORAL_TABLET | Freq: Four times a day (QID) | ORAL | Status: DC | PRN

## 2014-10-15 MED ORDER — ARTIFICIAL TEARS OP OINT
TOPICAL_OINTMENT | OPHTHALMIC | Status: DC | PRN
  Administered 2014-10-15: 1 via OPHTHALMIC

## 2014-10-15 MED ORDER — LACTATED RINGERS IV SOLN
INTRAVENOUS | Status: DC
Start: 2014-10-15 — End: 2014-10-15
  Administered 2014-10-15: 11:00:00 via INTRAVENOUS

## 2014-10-15 MED ORDER — FENTANYL CITRATE 0.05 MG/ML IJ SOLN
INTRAMUSCULAR | Status: AC
  Filled 2014-10-15: qty 5

## 2014-10-15 MED ORDER — GLYCOPYRROLATE 0.2 MG/ML IJ SOLN
INTRAMUSCULAR | Status: DC | PRN
  Administered 2014-10-15: 0.2 mg via INTRAVENOUS

## 2014-10-15 MED ORDER — CIPROFLOXACIN IN D5W 400 MG/200ML IV SOLN
400.0000 mg | INTRAVENOUS | Status: AC
  Administered 2014-10-15: 400 mg via INTRAVENOUS

## 2014-10-15 MED ORDER — DEXAMETHASONE SODIUM PHOSPHATE 4 MG/ML IJ SOLN
INTRAMUSCULAR | Status: AC
  Filled 2014-10-15: qty 2

## 2014-10-15 MED ORDER — KETOROLAC TROMETHAMINE 10 MG PO TABS: 10.0000 mg | ORAL_TABLET | Freq: Three times a day (TID) | ORAL | Status: DC | PRN

## 2014-10-15 MED ORDER — ARTIFICIAL TEARS OP OINT
TOPICAL_OINTMENT | OPHTHALMIC | Status: AC
  Filled 2014-10-15: qty 3.5

## 2014-10-15 MED ORDER — MIDAZOLAM HCL 2 MG/2ML IJ SOLN
1.0000 mg | INTRAMUSCULAR | Status: DC | PRN
  Administered 2014-10-15: 2 mg via INTRAVENOUS
  Filled 2014-10-15: qty 2

## 2014-10-15 MED ORDER — ONDANSETRON HCL 8 MG PO TABS: 8.0000 mg | ORAL_TABLET | Freq: Three times a day (TID) | ORAL | Status: DC | PRN

## 2014-10-15 MED ORDER — ONDANSETRON HCL 4 MG/2ML IJ SOLN
4.0000 mg | Freq: Once | INTRAMUSCULAR | Status: AC
  Administered 2014-10-15: 4 mg via INTRAVENOUS
  Filled 2014-10-15: qty 2

## 2014-10-15 MED ORDER — FENTANYL CITRATE 0.05 MG/ML IJ SOLN
INTRAMUSCULAR | Status: AC
  Filled 2014-10-15: qty 2

## 2014-10-15 MED ORDER — FENTANYL CITRATE 0.05 MG/ML IJ SOLN
INTRAMUSCULAR | Status: DC | PRN
  Administered 2014-10-15 (×2): 25 ug via INTRAVENOUS
  Administered 2014-10-15 (×2): 50 ug via INTRAVENOUS

## 2014-10-15 MED ORDER — SUCCINYLCHOLINE CHLORIDE 20 MG/ML IJ SOLN
INTRAMUSCULAR | Status: DC | PRN
  Administered 2014-10-15: 100 mg via INTRAVENOUS

## 2014-10-15 MED ORDER — KETOROLAC TROMETHAMINE 30 MG/ML IJ SOLN
30.0000 mg | Freq: Once | INTRAMUSCULAR | Status: AC
  Administered 2014-10-15: 30 mg via INTRAVENOUS
  Filled 2014-10-15: qty 1

## 2014-10-15 MED ORDER — FENTANYL CITRATE 0.05 MG/ML IJ SOLN
25.0000 ug | INTRAMUSCULAR | Status: DC | PRN
  Administered 2014-10-15: 50 ug via INTRAVENOUS

## 2014-10-15 SURGICAL SUPPLY — 25 items
ABLATOR ENDOMETRIAL BIPOLAR (ABLATOR) ×3 IMPLANT
BAG HAMPER (MISCELLANEOUS) ×3 IMPLANT
CLOTH BEACON ORANGE TIMEOUT ST (SAFETY) ×3 IMPLANT
COVER LIGHT HANDLE STERIS (MISCELLANEOUS) ×6 IMPLANT
FORMALIN 10 PREFIL 120ML (MISCELLANEOUS) ×3 IMPLANT
GLOVE BIOGEL PI IND STRL 8 (GLOVE) ×1 IMPLANT
GLOVE BIOGEL PI INDICATOR 8 (GLOVE) ×2
GLOVE ECLIPSE 8.0 STRL XLNG CF (GLOVE) ×3 IMPLANT
GLOVE EXAM NITRILE PF MED BLUE (GLOVE) ×3 IMPLANT
GLOVE INDICATOR 7.0 STRL GRN (GLOVE) ×3 IMPLANT
GLOVE SS BIOGEL STRL SZ 6.5 (GLOVE) ×1 IMPLANT
GLOVE SUPERSENSE BIOGEL SZ 6.5 (GLOVE) ×2
GOWN STRL REUS W/TWL LRG LVL3 (GOWN DISPOSABLE) ×3 IMPLANT
GOWN STRL REUS W/TWL XL LVL3 (GOWN DISPOSABLE) ×3 IMPLANT
INST SET HYSTEROSCOPY (KITS) ×3 IMPLANT
IV NS 1000ML (IV SOLUTION) ×2
IV NS 1000ML BAXH (IV SOLUTION) ×1 IMPLANT
KIT ROOM TURNOVER AP CYSTO (KITS) ×3 IMPLANT
MANIFOLD NEPTUNE II (INSTRUMENTS) ×3 IMPLANT
NS IRRIG 1000ML POUR BTL (IV SOLUTION) ×3 IMPLANT
PACK PERI GYN (CUSTOM PROCEDURE TRAY) ×3 IMPLANT
PAD ARMBOARD 7.5X6 YLW CONV (MISCELLANEOUS) ×3 IMPLANT
PAD TELFA 3X4 1S STER (GAUZE/BANDAGES/DRESSINGS) ×3 IMPLANT
SET BASIN LINEN APH (SET/KITS/TRAYS/PACK) ×3 IMPLANT
SET IRRIG Y TYPE TUR BLADDER L (SET/KITS/TRAYS/PACK) ×3 IMPLANT

## 2014-10-15 NOTE — H&P (Signed)
Patient ID: Lindsey Andrade, female DOB: 02/20/1976, 39 y.o. MRN: [id number] Preoperative History and Physical  Lindsey Andrade is a 39 y.o. G2P2 with Patient's last menstrual period was 09/20/2014. admitted for a hysteroscopy uterine curettage endometrial ablation.  Pt does have some discomfort with intercourse does not interrupt and does not feel at this time is big enough of an issue to consider a hysterectomy due to bump dyspareunia  PMH:  Past Medical History  Diagnosis Date  . Hypertension   . Arthritis     bursitis  . Genital warts   . Menorrhagia 09/11/2014  . Vaginal discharge 09/11/2014  . BV (bacterial vaginosis) 09/11/2014  . Dysmenorrhea 09/11/2014  . Constipation 09/11/2014  . Migraine headache with aura   . Migraines 09/24/2014    PSH:  Past Surgical History  Procedure Laterality Date  . Cesarean section  1999    POb/GynH:  OB History    Gravida Para Term Preterm AB TAB SAB Ectopic Multiple Living   _0 SH:  History  Substance Use Topics  . Smoking status: Former Smoker    Types: Cigarettes  . Smokeless tobacco: Never Used     Comment: quit 8-9 years ago  . Alcohol Use: No    FH:  Family History  Problem Relation Age of Onset  . Other Brother     has been sick x 15 years but no one knows what's wrong   . Cancer Maternal Aunt     breast  . Hypertension Maternal Grandmother   . Diabetes Maternal Grandmother   . Cancer Maternal Grandfather     lung  . Cancer Other     cervical; maternal side of family     Allergies:  Allergies  Allergen Reactions  . Keflex [Cephalexin] Anaphylaxis and Hives    Trouble breathing.  . Amoxicillin Other (See Comments)    All cillins- causes terrible yeast infection.    Medications:  Current outpatient prescriptions:  .  atenolol-chlorthalidone (TENORETIC) 50-25 MG per tablet, Take 1 tablet by mouth daily., Disp: , Rfl:  . ibuprofen (ADVIL,MOTRIN) 800 MG tablet, Take 1 tablet (800 mg total) by mouth every 8 (eight) hours as needed., Disp: 60 tablet, Rfl: 1 . metroNIDAZOLE (FLAGYL) 500 MG tablet, Take 1 tablet (500 mg total) by mouth 2 (two) times daily. (Patient not taking: Reported on 10/03/2014), Disp: 14 tablet, Rfl: 0  Review of Systems:   Review of Systems  Constitutional: Negative for fever, chills, weight loss, malaise/fatigue and diaphoresis.  HENT: Negative for hearing loss, ear pain, nosebleeds, congestion, sore throat, neck pain, tinnitus and ear discharge.  Eyes: Negative for blurred vision, double vision, photophobia, pain, discharge and redness.  Respiratory: Negative for cough, hemoptysis, sputum production, shortness of breath, wheezing and stridor.  Cardiovascular: Negative for chest pain, palpitations, orthopnea, claudication, leg swelling and PND.  Gastrointestinal: Positive for abdominal pain. Negative for heartburn, nausea, vomiting, diarrhea, constipation, blood in stool and melena.  Genitourinary: Negative for dysuria, urgency, frequency, hematuria and flank pain.  Musculoskeletal: Negative for myalgias, back pain, joint pain and falls.  Skin: Negative for itching and rash.  Neurological: Negative for dizziness, tingling, tremors, sensory change, speech change, focal weakness, seizures, loss of consciousness, weakness and headaches.  Endo/Heme/Allergies: Negative for environmental allergies and polydipsia. Does not bruise/bleed easily.  Psychiatric/Behavioral: Negative for depression, suicidal ideas, hallucinations, memory loss and substance abuse. The patient is not nervous/anxious and does not  have insomnia.     PHYSICAL EXAM:  Blood pressure 114/80, pulse 60, height 5' 1" (1.549 m), weight 156 lb 8 oz (70.988 kg), last menstrual period 09/20/2014.   Vitals  reviewed. Constitutional: She is oriented to person, place, and time. She appears well-developed and well-nourished.  HENT:  Head: Normocephalic and atraumatic.  Right Ear: External ear normal.  Left Ear: External ear normal.  Nose: Nose normal.  Mouth/Throat: Oropharynx is clear and moist.  Eyes: Conjunctivae and EOM are normal. Pupils are equal, round, and reactive to light. Right eye exhibits no discharge. Left eye exhibits no discharge. No scleral icterus.  Neck: Normal range of motion. Neck supple. No tracheal deviation present. No thyromegaly present.  Cardiovascular: Normal rate, regular rhythm, normal heart sounds and intact distal pulses. Exam reveals no gallop and no friction rub.  No murmur heard. Respiratory: Effort normal and breath sounds normal. No respiratory distress. She has no wheezes. She has no rales. She exhibits no tenderness.  GI: Soft. Bowel sounds are normal. She exhibits no distension and no mass. There is tenderness. There is no rebound and no guarding.  Genitourinary:   Vulva is normal without lesions Vagina is pink moist without discharge Cervix normal in appearance and pap is normal Uterus is minimally enlarged no endometrial pathology noted on sonogram Adnexa is negative with normal sized ovaries by sonogram  Musculoskeletal: Normal range of motion. She exhibits no edema and no tenderness.  Neurological: She is alert and oriented to person, place, and time. She has normal reflexes. She displays normal reflexes. No cranial nerve deficit. She exhibits normal muscle tone. Coordination normal.  Skin: Skin is warm and dry. No rash noted. No erythema. No pallor.  Psychiatric: She has a normal mood and affect. Her behavior is normal. Judgment and thought content normal.    Labs: Recent Results (from the past 2160 hour(s))  Cytology - PAP     Status: None   Collection Time: 09/11/14 12:00 AM  Result Value Ref Range   CYTOLOGY - PAP PAP RESULT   POCT  Wet Prep [facility])     Status: None   Collection Time: 09/11/14  2:31 PM  Result Value Ref Range   Source Wet Prep POC     WBC, Wet Prep HPF POC few    Bacteria Wet Prep HPF POC     BACTERIA WET PREP MORPHOLOGY POC     Clue Cells Wet Prep HPF POC Many    Clue Cells Wet Prep Whiff POC     Yeast Wet Prep HPF POC     KOH Wet Prep POC     Trichomonas Wet Prep HPF POC    POCT occult blood stool     Status: None   Collection Time: 09/11/14  2:31 PM  Result Value Ref Range   Fecal Occult Blood, POC Negative    Card #1 Date     Card #2 Fecal Occult Blod, POC     Card #2 Date     Card #3 Fecal Occult Blood, POC     Card #3 Date    CBC     Status: Abnormal   Collection Time: 09/11/14  3:02 PM  Result Value Ref Range   WBC 10.0 3.4 - 10.8 x10E3/uL   RBC 5.02 3.77 - 5.28 x10E6/uL   Hemoglobin 14.8 11.1 - 15.9 g/dL   HCT 42.6 34.0 - 46.6 %   MCV 85 79 - 97 fL   MCH 29.5 26.6 - 33.0  pg   MCHC 34.7 31.5 - 35.7 g/dL   RDW 13.3 12.3 - 15.4 %   Platelets 411 (H) 150 - 379 x10E3/uL  Comprehensive metabolic panel     Status: Abnormal   Collection Time: 09/11/14  3:02 PM  Result Value Ref Range   Glucose 99 65 - 99 mg/dL   BUN 9 6 - 24 mg/dL   Creatinine, Ser 0.71 0.57 - 1.00 mg/dL   GFR calc non Af Amer 107 >59 mL/min/1.73   GFR calc Af Amer 123 >59 mL/min/1.73   BUN/Creatinine Ratio 13 9 - 23   Sodium 138 134 - 144 mmol/L   Potassium 3.4 (L) 3.5 - 5.2 mmol/L   Chloride 96 (L) 97 - 108 mmol/L   CO2 26 18 - 29 mmol/L   Calcium 9.9 8.7 - 10.2 mg/dL   Total Protein 7.8 6.0 - 8.5 g/dL   Albumin 4.4 3.5 - 5.5 g/dL   Globulin, Total 3.4 1.5 - 4.5 g/dL   Albumin/Globulin Ratio 1.3 1.1 - 2.5   Bilirubin Total 0.4 0.0 - 1.2 mg/dL   Alkaline Phosphatase 54 39 - 117 IU/L   AST 18 0 - 40 IU/L   ALT 13 0 - 32 IU/L  TSH     Status: None   Collection Time: 09/11/14  3:02 PM  Result Value Ref Range   TSH 0.793 0.450 - 4.500 uIU/mL  Lipid panel     Status: Abnormal   Collection Time:  09/11/14  3:02 PM  Result Value Ref Range   Cholesterol, Total 114 100 - 199 mg/dL   Triglycerides 186 (H) 0 - 149 mg/dL   HDL 38 (L) >39 mg/dL    Comment: According to ATP-III Guidelines, HDL-C >59 mg/dL is considered a negative risk factor for CHD.    VLDL Cholesterol Cal 37 5 - 40 mg/dL   LDL Calculated 39 0 - 99 mg/dL   Chol/HDL Ratio 3.0 0.0 - 4.4 ratio units    Comment:                                   T. Chol/HDL Ratio                                             Men  Women                               1/2 Avg.Risk  3.4    3.3                                   Avg.Risk  5.0    4.4                                2X Avg.Risk  9.6    7.1                                3X Avg.Risk 23.4   11.0   hCG, serum, qualitative     Status: None   Collection Time: 10/10/14  9:00 AM  Result Value Ref Range   Preg, Serum NEGATIVE NEGATIVE  CBC     Status: None   Collection Time: 10/10/14  9:00 AM  Result Value Ref Range   WBC 9.6 4.0 - 10.5 K/uL   RBC 4.90 3.87 - 5.11 MIL/uL   Hemoglobin 14.6 12.0 - 15.0 g/dL   HCT 42.6 36.0 - 46.0 %   MCV 86.9 78.0 - 100.0 fL   MCH 29.8 26.0 - 34.0 pg   MCHC 34.3 30.0 - 36.0 g/dL   RDW 13.0 11.5 - 15.5 %   Platelets 353 150 - 400 K/uL  Comprehensive metabolic panel     Status: Abnormal   Collection Time: 10/10/14  9:00 AM  Result Value Ref Range   Sodium 136 135 - 145 mmol/L   Potassium 3.3 (L) 3.5 - 5.1 mmol/L   Chloride 100 96 - 112 mmol/L   CO2 26 19 - 32 mmol/L   Glucose, Bld 96 70 - 99 mg/dL   BUN 15 6 - 23 mg/dL   Creatinine, Ser 0.73 0.50 - 1.10 mg/dL   Calcium 9.5 8.4 - 10.5 mg/dL   Total Protein 7.6 6.0 - 8.3 g/dL   Albumin 4.0 3.5 - 5.2 g/dL   AST 22 0 - 37 U/L   ALT 17 0 - 35 U/L   Alkaline Phosphatase 43 39 - 117 U/L   Total Bilirubin 0.8 0.3 - 1.2 mg/dL   GFR calc non Af Amer >90 >90 mL/min   GFR calc Af Amer >90 >90 mL/min    Comment: (NOTE) The eGFR has been calculated using the CKD EPI equation. This calculation has not  been validated in all clinical situations. eGFR's persistently <90 mL/min signify possible Chronic Kidney Disease.    Anion gap 10 5 - 15  Urinalysis, Routine w reflex microscopic     Status: Abnormal   Collection Time: 10/10/14  9:00 AM  Result Value Ref Range   Color, Urine YELLOW YELLOW   APPearance CLEAR CLEAR   Specific Gravity, Urine 1.010 1.005 - 1.030   pH 7.0 5.0 - 8.0   Glucose, UA NEGATIVE NEGATIVE mg/dL   Hgb urine dipstick NEGATIVE NEGATIVE   Bilirubin Urine NEGATIVE NEGATIVE   Ketones, ur NEGATIVE NEGATIVE mg/dL   Protein, ur NEGATIVE NEGATIVE mg/dL   Urobilinogen, UA 0.2 0.0 - 1.0 mg/dL   Nitrite NEGATIVE NEGATIVE   Leukocytes, UA TRACE (A) NEGATIVE  Urine microscopic-add on     Status: Abnormal   Collection Time: 10/10/14  9:00 AM  Result Value Ref Range   Squamous Epithelial / LPF FEW (A) RARE   WBC, UA 7-10 <3 WBC/hpf   Bacteria, UA RARE RARE   Casts WBC CAST (A) NEGATIVE    EKG: No orders found for this or any previous visit.  Imaging Studies:  Imaging Results    Dg Shoulder Right  09/17/2014 CLINICAL DATA: Right shoulder pain for several years, worsening recently, no injury EXAM: RIGHT SHOULDER - 2+ VIEW COMPARISON: None. FINDINGS: The right humeral head is in normal position and the glenohumeral joint space appears normal for age. The right [facility] joint is normally aligned. No acute abnormality is seen. IMPRESSION: Negative. Electronically Signed By: Ivar Drape M.D. On: 09/17/2014 09:53   US Pelvis Complete  09/22/2014 GYNECOLOGIC SONOGRAM VIRIGINIA AMENDOLA is a 39 y.o. G2P2 LMP January 2016 for a pelvic sonogram for dyspareunia and menorrhagia. Uterus  Anteverted 10.4 x 6.0 x 4.1 cm, Endometrium 8.2 mm, symmetrical, Right ovary 2.1 x 2.0  x 1.6 cm, Left ovary 2.6 x 2.3 x 1.8 cm, No free fluid or adnexal masses noted within the pelvis Technician Comments: Anteverted uterus,  ENdom-8.73m, bilateral adnexa/ovaries appear WNL, no free fluid or adnexal masses noted within the pelvis MLazarus Gowda2/26/2016 11:19 AM Clinical Impression and recommendations: I have reviewed the sonogram results above. Combined with the patient's current clinical course, below are my impressions and any appropriate recommendations for management based on the sonographic findings: 1. Mildly enlarged uterus , 135 gm, anteverted, slight retroflexion. 2. Trilaminar endometrial stripe, consistent with proliferative endometrium 3. Normal adnexal structures. FERGUSON,JOHN V   Mm Digital Screening Bilateral  09/24/2014 CLINICAL DATA: Screening. EXAM: DIGITAL SCREENING BILATERAL MAMMOGRAM WITH CAD COMPARISON: None. ACR Breast Density Category b: There are scattered areas of fibroglandular density. FINDINGS: There are no findings suspicious for malignancy. Images were processed with CAD. IMPRESSION: No mammographic evidence of malignancy. A result letter of this screening mammogram will be mailed directly to the patient. RECOMMENDATION: Screening mammogram in one year. (Code:SM-B-01Y) BI-RADS CATEGORY 1: Negative. Electronically Signed By: SClaudie ReveringM.D. On: 09/24/2014 16:22       Assessment: Menorrhagia dysmenorrhea Patient Active Problem List   Diagnosis Date Noted  . Migraines 09/24/2014  . Menorrhagia 09/11/2014  . Vaginal discharge 09/11/2014  . BV (bacterial vaginosis) 09/11/2014  . Dysmenorrhea 09/11/2014  . Constipation 09/11/2014    Plan: Hysteroscopy uterine curettage endometrial ablation     Khara Renaud H 10/15/2014 10:59 AM

## 2014-10-15 NOTE — Transfer of Care (Signed)
Immediate Anesthesia Transfer of Care Note  Patient: Lindsey Andrade  Procedure(s) Performed: Procedure(s): DILATATION & CURETTAGE/HYSTEROSCOPY WITH NOVASURE ABLATION Uterine cavity length 5.5cm uterine cavity width 4.4 cm Power 13 watts Time 1min 26secs (N/A)  Patient Location: PACU  Anesthesia Type:General  Level of Consciousness: awake, alert , oriented and patient cooperative  Airway & Oxygen Therapy: Patient Spontanous Breathing and Patient connected to face mask oxygen  Post-op Assessment: Report given to RN and Post -op Vital signs reviewed and stable  Post vital signs: Reviewed and stable  Last Vitals:  Filed Vitals:   10/15/14 1120  BP: 111/68  Pulse:   Resp: 12    ComplicationsSmall laceration posterion oropharynx from LMA; Treated with decadron

## 2014-10-15 NOTE — Discharge Instructions (Signed)
Endometrial Ablation Endometrial ablation removes the lining of the uterus (endometrium). It is usually a same-day, outpatient treatment. Ablation helps avoid major surgery, such as surgery to remove the cervix and uterus (hysterectomy). After endometrial ablation, you will have little or no menstrual bleeding and may not be able to have children. However, if you are premenopausal, you will need to use a reliable method of birth control following the procedure because of the small chance that pregnancy can occur. There are different reasons to have this procedure, which include:  Heavy periods.  Bleeding that is causing anemia.  Irregular bleeding.  Bleeding fibroids on the lining inside the uterus if they are smaller than 3 centimeters. This procedure should not be done if:  You want children in the future.  You have severe cramps with your menstrual period.  You have precancerous or cancerous cells in your uterus.  You were recently pregnant.  You have gone through menopause.  You have had major surgery on the uterus, such as a cesarean delivery. LET YOUR HEALTH CARE PROVIDER KNOW ABOUT:  Any allergies you have.  All medicines you are taking, including vitamins, herbs, eye drops, creams, and over-the-counter medicines.  Previous problems you or members of your family have had with the use of anesthetics.  Any blood disorders you have.  Previous surgeries you have had.  Medical conditions you have. RISKS AND COMPLICATIONS  Generally, this is a safe procedure. However, as with any procedure, complications can occur. Possible complications include:  Perforation of the uterus.  Bleeding.  Infection of the uterus, bladder, or vagina.  Injury to surrounding organs.  An air bubble to the lung (air embolus).  Pregnancy following the procedure.  Failure of the procedure to help the problem, requiring hysterectomy.  Decreased ability to diagnose cancer in the lining of  the uterus. BEFORE THE PROCEDURE  The lining of the uterus must be tested to make sure there is no pre-cancerous or cancer cells present.  An ultrasound may be performed to look at the size of the uterus and to check for abnormalities.  Medicines may be given to thin the lining of the uterus. PROCEDURE  During the procedure, your health care provider will use a tool called a resectoscope to help see inside your uterus. There are different ways to remove the lining of your uterus.   Radiofrequency - This method uses a radiofrequency-alternating electric current to remove the lining of the uterus.  Cryotherapy - This method uses extreme cold to freeze the lining of the uterus.  Heated-Free Liquid - This method uses heated salt (saline) solution to remove the lining of the uterus.  Microwave - This method uses high-energy microwaves to heat up the lining of the uterus to remove it.  Thermal balloon - This method involves inserting a catheter with a balloon tip into the uterus. The balloon tip is filled with heated fluid to remove the lining of the uterus. AFTER THE PROCEDURE  After your procedure, do not have sexual intercourse or insert anything into your vagina until permitted by your health care provider. After the procedure, you may experience:  Cramps.  Vaginal discharge.  Frequent urination. Document Released: 05/20/2004 Document Revised: 03/13/2013 Document Reviewed: 12/12/2012 Exit[employer] Patient Information 2015 [employer]. This information is not intended to replace advice given to you by your health care provider. Make sure you discuss any questions you have with your health care provider.  

## 2014-10-15 NOTE — Anesthesia Postprocedure Evaluation (Signed)
  Anesthesia Post-op Note  Patient: Arline Asplicia D Faiella  Procedure(s) Performed: Procedure(s): DILATATION & CURETTAGE/HYSTEROSCOPY WITH NOVASURE ABLATION Uterine cavity length 5.5cm uterine cavity width 4.4 cm Power 13 watts Time 1min 26secs (N/A)  Patient Location: PACU  Anesthesia Type:General  Level of Consciousness: awake, alert , oriented and patient cooperative  Airway and Oxygen Therapy: Patient Spontanous Breathing  Post-op Pain: mild  Post-op Assessment: Post-op Vital signs reviewed, Patient's Cardiovascular Status Stable, Respiratory Function Stable, Patent Airway, No signs of Nausea or vomiting and Pain level controlled  Post-op Vital Signs: Reviewed and stable  Last Vitals:  Filed Vitals:   10/15/14 1335  BP: 110/75  Pulse: 87  Temp: 36.7 C  Resp: 16    Complications: No apparent anesthesia complications

## 2014-10-15 NOTE — Op Note (Addendum)
Preoperative diagnosis: Menometrorrhagia                                        Dysmenorrhea                                          Postoperative diagnoses: Same as above   Procedure: Hysteroscopy, uterine curettage, endometrial ablation using Novasure  Surgeon: Lazaro ArmsEURE,Shanesha Bednarz H   Anesthesia: Laryngeal mask airway  Findings: The endometrium was significant for small endometrial polyp. There were no fibroid or other abnormalities.  Description of operation: The patient was taken to the operating room and placed in the supine position. She underwent general anesthesia using the laryngeal mask airway. She was placed in the dorsal lithotomy position and prepped and draped in the usual sterile fashion. A Graves speculum was placed and the anterior cervical lip was grasped with a single-tooth tenaculum. The cervix was dilated serially to allow passage of the hysteroscope. Diagnostic hysteroscopy was performed and was found to be normal. A vigorous uterine curettage was then performed and all tissue sent to pathology for evaluation.  I then proceeded to perform the Novasure endometrial ablation.  The cervical length was 3.5. The uterus sounded to  9 cm yielding a net length of 5.5 cm.  The endometrial cavity was 4.4 cm wide. The power was 133 watts.  The total time of therapy was 1 min 26 seconds. The array was evaluated after the procedure and tissue was adherent on all the dimensions of the surface, confirming fundal treatment as well.    All of the equipment worked well throughout the procedure.  The patient was awakened from anesthesia and taken to the recovery room in good stable condition all counts were correct. She received cipro and cleocin and 30 mg of Toradol preoperatively. She will be discharged from the recovery room and followed up in the office in 1- 2 weeks.  Hennessey Cantrell H 10/15/2014 12:07 PM

## 2014-10-15 NOTE — Anesthesia Preprocedure Evaluation (Signed)
Anesthesia Evaluation  Patient identified by MRN, date of birth, ID band Patient awake    Reviewed: Allergy & Precautions, NPO status , Patient's Chart, lab work & pertinent test results  Airway Mallampati: II  TM Distance: >3 FB     Dental  (+) Loose, Poor Dentition,    Pulmonary former smoker,  breath sounds clear to auscultation        Cardiovascular hypertension, Pt. on medications Rhythm:Regular Rate:Normal     Neuro/Psych  Headaches,    GI/Hepatic negative GI ROS,   Endo/Other    Renal/GU      Musculoskeletal   Abdominal   Peds  Hematology   Anesthesia Other Findings   Reproductive/Obstetrics                             Anesthesia Physical Anesthesia Plan  ASA: II  Anesthesia Plan: General   Post-op Pain Management:    Induction: Intravenous  Airway Management Planned: LMA  Additional Equipment:   Intra-op Plan:   Post-operative Plan: Extubation in OR  Informed Consent: I have reviewed the patients History and Physical, chart, labs and discussed the procedure including the risks, benefits and alternatives for the proposed anesthesia with the patient or authorized representative who has indicated his/her understanding and acceptance.     Plan Discussed with:   Anesthesia Plan Comments:         Anesthesia Quick Evaluation

## 2014-10-15 NOTE — Anesthesia Procedure Notes (Addendum)
Procedure Name: LMA Insertion Date/Time: 10/15/2014 11:31 AM Performed by: Pernell DupreADAMS, AMY A Pre-anesthesia Checklist: Patient identified, Timeout performed, Emergency Drugs available, Suction available and Patient being monitored Patient Re-evaluated:Patient Re-evaluated prior to inductionOxygen Delivery Method: Circle system utilized Intubation Type: IV induction Ventilation: Mask ventilation without difficulty LMA: LMA inserted LMA Size: 4.0 Number of attempts: 1 Placement Confirmation: positive ETCO2 and breath sounds checked- equal and bilateral Tube secured with: Tape Dental Injury: Teeth and Oropharynx as per pre-operative assessment    Procedure Name: Intubation Date/Time: 10/15/2014 11:42 AM Performed by: Pernell DupreADAMS, AMY A Pre-anesthesia Checklist: Patient identified, Patient being monitored, Timeout performed, Emergency Drugs available and Suction available Patient Re-evaluated:Patient Re-evaluated prior to inductionOxygen Delivery Method: Circle System Utilized Preoxygenation: Pre-oxygenation with 100% oxygen Intubation Type: IV induction Ventilation: Mask ventilation without difficulty Laryngoscope Size: 3 and Miller Grade View: Grade I Tube type: Oral Tube size: 7.0 mm Number of attempts: 1 Airway Equipment and Method: Stylet Placement Confirmation: ETT inserted through vocal cords under direct vision,  positive ETCO2 and breath sounds checked- equal and bilateral Secured at: 21 cm Tube secured with: Tape Dental Injury: Teeth and Oropharynx as per pre-operative assessment

## 2014-10-16 ENCOUNTER — Telehealth: Payer: Self-pay | Admitting: Obstetrics & Gynecology

## 2014-10-16 ENCOUNTER — Encounter (HOSPITAL_COMMUNITY): Payer: Self-pay | Admitting: Obstetrics & Gynecology

## 2014-10-16 NOTE — Telephone Encounter (Signed)
Pt states she had endometrial ablation with Dr Despina HiddenEure yesterday, c/o pain in shoulders and  Back pain, sore throat, no fever. Pt informed normal to have sore throat after being put to sleep. Pt informed to take pain meds (toradol and Norco) as prescribed by Dr. Despina HiddenEure. Lay on knees face down  to relieve some of the  pressure (gas or air pressure) were pt was put to sleep. Pt informed if no improvement to call our office back, if temp occurs, discharge with color and odor call office back. Pt verbalized understanding.

## 2014-10-22 ENCOUNTER — Encounter: Payer: Self-pay | Admitting: Obstetrics & Gynecology

## 2014-10-22 ENCOUNTER — Ambulatory Visit (INDEPENDENT_AMBULATORY_CARE_PROVIDER_SITE_OTHER): Payer: Medicaid Other | Admitting: Obstetrics & Gynecology

## 2014-10-22 VITALS — BP 110/80 | HR 80 | Wt 155.0 lb

## 2014-10-22 DIAGNOSIS — Z9889 Other specified postprocedural states: Secondary | ICD-10-CM | POA: Diagnosis not present

## 2014-10-22 NOTE — Progress Notes (Signed)
Patient ID: Lindsey Andrade, female   DOB: 02/18/1976, 39 y.o.   MRN: [id number]  HPI: Patient returns for routine postoperative follow-up having undergone hysteroscopy uterine cureattge endometrial ablation using NovaSure on 10/14/2013.  The patient's immediate postoperative recovery has been unremarkable. Since hospital discharge the patient reports no problems, sore throat.   Current Outpatient Prescriptions: atenolol-chlorthalidone (TENORETIC) 50-25 MG per tablet, Take 1 tablet by mouth daily., Disp: , Rfl:  cyclobenzaprine (FLEXERIL) 10 MG tablet, Take 10 mg by mouth 3 (three) times daily as needed for muscle spasms., Disp: , Rfl:  HYDROcodone-acetaminophen (NORCO/VICODIN) 5-325 MG per tablet, Take 1 tablet by mouth every 6 (six) hours as needed. (Patient not taking: Reported on 10/22/2014), Disp: 30 tablet, Rfl: 0 ibuprofen (ADVIL,MOTRIN) 800 MG tablet, Take 1 tablet (800 mg total) by mouth every 8 (eight) hours as needed. (Patient not taking: Reported on 10/22/2014), Disp: 60 tablet, Rfl: 1 ketorolac (TORADOL) 10 MG tablet, Take 1 tablet (10 mg total) by mouth every 8 (eight) hours as needed. (Patient not taking: Reported on 10/22/2014), Disp: 15 tablet, Rfl: 0 ondansetron (ZOFRAN) 8 MG tablet, Take 1 tablet (8 mg total) by mouth every 8 (eight) hours as needed for nausea. (Patient not taking: Reported on 10/22/2014), Disp: 12 tablet, Rfl: 0  No current facility-administered medications for this visit.    Blood pressure 110/80, pulse 80, weight 155 lb (70.308 kg), last menstrual period 09/20/2014.  Physical Exam: General Appearance:  well developed, well nourished and in no acute distress  Vulva:  normal appearing vulva with no masses, tenderness or lesions Vagina:  normal mucosa, scant blood Cervix:  no lesions and normal Uterus:  normal size, contour, position, consistency, mobility, non-tender Adnexa: ovaries:present,  normal adnexa in size, nontender and no masses    Diagnostic  Tests: none  Pathology: benign  Impression: S/P endometrial ablation, uterine curettage  Plan: Follow up: 1  years  Lazaro ArmsEURE,Chyler Creely H, MD

## 2014-12-29 ENCOUNTER — Other Ambulatory Visit: Payer: Self-pay | Admitting: Adult Health

## 2015-09-15 ENCOUNTER — Ambulatory Visit (INDEPENDENT_AMBULATORY_CARE_PROVIDER_SITE_OTHER): Payer: Medicaid Other | Admitting: Adult Health

## 2015-09-15 ENCOUNTER — Encounter: Payer: Self-pay | Admitting: Adult Health

## 2015-09-15 VITALS — BP 100/80 | HR 70 | Ht 61.25 in | Wt 162.0 lb

## 2015-09-15 DIAGNOSIS — R350 Frequency of micturition: Secondary | ICD-10-CM

## 2015-09-15 DIAGNOSIS — Z1212 Encounter for screening for malignant neoplasm of rectum: Secondary | ICD-10-CM

## 2015-09-15 DIAGNOSIS — L739 Follicular disorder, unspecified: Secondary | ICD-10-CM

## 2015-09-15 DIAGNOSIS — K5900 Constipation, unspecified: Secondary | ICD-10-CM

## 2015-09-15 DIAGNOSIS — Z0000 Encounter for general adult medical examination without abnormal findings: Secondary | ICD-10-CM | POA: Diagnosis not present

## 2015-09-15 DIAGNOSIS — Z01419 Encounter for gynecological examination (general) (routine) without abnormal findings: Secondary | ICD-10-CM | POA: Diagnosis not present

## 2015-09-15 DIAGNOSIS — G43109 Migraine with aura, not intractable, without status migrainosus: Secondary | ICD-10-CM

## 2015-09-15 HISTORY — DX: Frequency of micturition: R35.0

## 2015-09-15 HISTORY — DX: Follicular disorder, unspecified: L73.9

## 2015-09-15 LAB — HEMOCCULT GUIAC POC 1CARD (OFFICE): Fecal Occult Blood, POC: NEGATIVE

## 2015-09-15 LAB — POCT URINALYSIS DIPSTICK
Blood, UA: NEGATIVE
Glucose, UA: NEGATIVE
LEUKOCYTES UA: NEGATIVE
Nitrite, UA: NEGATIVE
PROTEIN UA: NEGATIVE

## 2015-09-15 MED ORDER — HYDROCODONE-ACETAMINOPHEN 5-325 MG PO TABS: 1.0000 | ORAL_TABLET | Freq: Four times a day (QID) | ORAL | Status: DC | PRN

## 2015-09-15 MED ORDER — SULFAMETHOXAZOLE-TRIMETHOPRIM 800-160 MG PO TABS: 1.0000 | ORAL_TABLET | Freq: Two times a day (BID) | ORAL | Status: DC

## 2015-09-15 NOTE — Patient Instructions (Signed)
Physical in 1 year, pap in 2019 Mammogram yearly Try colace and increase water and fruits

## 2015-09-15 NOTE — Progress Notes (Addendum)
Patient ID: Lindsey Andrade, female   DOB: 07/08/1976, 40 y.o.   MRN: [id number] History of Present Illness: Lindsey Andrade is a 40 year old white female, separated,(has moved in with her Mom and is happy) in for a well woman gyn exam, she had a normal pap with negative HPV last year. She complains of migraine today and urinary frequency at times.Has chronic constipation. PCP is Dr Lindsey Andrade.   Current Medications, Allergies, Past Medical History, Past Surgical History, Family History and Social History were reviewed in [employer] [facility] electronic medical record.     Review of Systems: Patient denies any daily headaches, hearing loss, fatigue, blurred vision, shortness of breath, chest pain, abdominal pain, problems with  or intercourse(not having sex). No joint pain or mood swings. See HPI for positives.   Physical Exam:BP 100/80 mmHg  Pulse 70  Ht 5' 1.25" (1.556 m)  Wt 162 lb (73.483 kg)  BMI 30.35 kg/m2 urine dipstick negative  General:  Well developed, well nourished, no acute distress Skin:  Warm and dry Neck:  Midline trachea, normal thyroid, good ROM, no lymphadenopathy Lungs; Clear to auscultation bilaterally Breast:  No dominant palpable mass, retraction, or nipple discharge Cardiovascular: Regular rate and rhythm Abdomen:  Soft, non tender, no hepatosplenomegaly Pelvic:  External genitalia is normal in appearance, has 2 areas of folliculitis on mons pubis.  The vagina is normal in appearance. Urethra has no lesions or masses. The cervix is bulbous.  Uterus is felt to be normal size, shape, and contour.  No adnexal masses or tenderness noted.Bladder is non tender, no masses felt. Rectal: Good sphincter tone, no polyps, or hemorrhoids felt.  Hemoccult negative. Extremities/musculoskeletal:  No swelling or varicosities noted, no clubbing or cyanosis Psych:  No mood changes, alert and cooperative,seems happy Discussed using different razor to shave peri area.   Impression: Well woman gyn  exam no pap Migraine  Constipation  Urinary frequency Folliculitis    Plan: Rx norco 5-325 mg #30 take 1 every 6 hours prn pain no refills Rx septra ds 1 qid x 7 days #28 with 1 refill Physical in 1 year, pap in 2019 Mammogram yearly Increase water and fruits and try colace

## 2015-11-20 ENCOUNTER — Other Ambulatory Visit (HOSPITAL_COMMUNITY): Payer: Self-pay | Admitting: Internal Medicine

## 2016-09-15 ENCOUNTER — Ambulatory Visit (INDEPENDENT_AMBULATORY_CARE_PROVIDER_SITE_OTHER): Payer: Medicaid Other | Admitting: Adult Health

## 2016-09-15 ENCOUNTER — Encounter: Payer: Self-pay | Admitting: Adult Health

## 2016-09-15 VITALS — BP 120/80 | HR 82 | Ht 61.0 in | Wt 164.5 lb

## 2016-09-15 DIAGNOSIS — Z3009 Encounter for other general counseling and advice on contraception: Secondary | ICD-10-CM | POA: Insufficient documentation

## 2016-09-15 DIAGNOSIS — Z308 Encounter for other contraceptive management: Secondary | ICD-10-CM

## 2016-09-15 DIAGNOSIS — Z1211 Encounter for screening for malignant neoplasm of colon: Secondary | ICD-10-CM

## 2016-09-15 DIAGNOSIS — Z01419 Encounter for gynecological examination (general) (routine) without abnormal findings: Secondary | ICD-10-CM | POA: Insufficient documentation

## 2016-09-15 DIAGNOSIS — K5900 Constipation, unspecified: Secondary | ICD-10-CM

## 2016-09-15 DIAGNOSIS — Z1212 Encounter for screening for malignant neoplasm of rectum: Secondary | ICD-10-CM

## 2016-09-15 LAB — HEMOCCULT GUIAC POC 1CARD (OFFICE): FECAL OCCULT BLD: NEGATIVE

## 2016-09-15 NOTE — Progress Notes (Signed)
Patient ID: Lindsey Andrade, female   DOB: 11/22/1975, 41 y.o.   MRN: [id number] History of Present Illness:  Lindsey Andrade is a 41 year old white female, widowed in for a well woman gyn exam,she had a normal pap with negative HPV 09/11/14. Her husband died July 2017.  Current Medications, Allergies, Past Medical History, Past Surgical History, Family History and Social History were reviewed in [facility] electronic medical record.     Review of Systems: Patient denies any headaches, hearing loss, fatigue, blurred vision, shortness of breath, chest pain, abdominal pain, problems urination, or intercourse(not having sex). No joint pain or mood swings.+chronic constipation, uses dulcolax 1-2 x weekly.Had torn rotator cuff right shoulder.  She is sp tubal ligation.    Physical Exam:BP 120/80 (BP Location: Left Arm, Patient Position: Sitting, Cuff Size: Normal)   Pulse 82   Ht 5\' 1"  (1.549 m)   Wt 164 lb 8 oz (74.6 kg)   BMI 31.08 kg/m  General:  Well developed, well nourished, no acute distress Skin:  Warm and dry Neck:  Midline trachea, normal thyroid, good ROM, no lymphadenopathy Lungs; Clear to auscultation bilaterally Breast:  No dominant palpable mass, retraction, or nipple discharge Cardiovascular: Regular rate and rhythm Abdomen:  Soft, non tender, no hepatosplenomegaly Pelvic:  External genitalia is normal in appearance, no lesions.  The vagina is normal in appearance. Urethra has no lesions or masses. The cervix is bulbous.  Uterus is felt to be normal size, shape, and contour.  No adnexal masses or tenderness noted.Bladder is non tender, no masses felt.GC/CHL obtained Rectal: Good sphincter tone, no polyps, or hemorrhoids felt.  Hemoccult negative. Extremities/musculoskeletal:  No swelling or varicosities noted, no clubbing or cyanosis Psych:  No mood changes, alert and cooperative,seems happy PHQ 2 score 0.   Impression: 1. Well woman exam with routine gynecological exam   2.  Family planning   3. Constipation, unspecified constipation type   4. Screening for colorectal cancer       Plan: Pap and physical in 1 year GC/CHL sent Check HIV and RPR Mammogram advised

## 2016-09-16 LAB — HIV ANTIBODY (ROUTINE TESTING W REFLEX): HIV SCREEN 4TH GENERATION: NONREACTIVE

## 2016-09-16 LAB — RPR: RPR Ser Ql: NONREACTIVE

## 2016-09-17 LAB — GC/CHLAMYDIA PROBE AMP
Chlamydia trachomatis, NAA: NEGATIVE
NEISSERIA GONORRHOEAE BY PCR: NEGATIVE

## 2017-01-17 ENCOUNTER — Telehealth: Payer: Self-pay | Admitting: Adult Health

## 2017-01-17 NOTE — Telephone Encounter (Signed)
Patient called stating she is having burning, urgency, frequency and pressure, thinks she may have a UTI. She is also having external vaginal itching. She only has FP MCD. Can she leave a urine specimen or schedule appt? Please advise.

## 2017-01-18 NOTE — Telephone Encounter (Signed)
Informed patient per Victorino DikeJennifer to try Monistat first and to call us back if she does not get any relief. Verbalized understanding.

## 2017-12-27 ENCOUNTER — Other Ambulatory Visit: Payer: Self-pay | Admitting: Adult Health

## 2017-12-27 DIAGNOSIS — Z1231 Encounter for screening mammogram for malignant neoplasm of breast: Secondary | ICD-10-CM

## 2018-01-03 ENCOUNTER — Ambulatory Visit (HOSPITAL_COMMUNITY)
Admission: RE | Admit: 2018-01-03 | Discharge: 2018-01-03 | Disposition: A | Discharge status: Home / Self Care | Payer: Medicaid Other | Source: Ambulatory Visit | Attending: Adult Health | Admitting: Adult Health

## 2018-01-03 ENCOUNTER — Encounter (HOSPITAL_COMMUNITY): Payer: Self-pay

## 2018-01-03 DIAGNOSIS — Z1231 Encounter for screening mammogram for malignant neoplasm of breast: Secondary | ICD-10-CM | POA: Insufficient documentation

## 2018-01-23 ENCOUNTER — Other Ambulatory Visit (HOSPITAL_COMMUNITY)
Admission: RE | Admit: 2018-01-23 | Discharge: 2018-01-23 | Disposition: A | Discharge status: Home / Self Care | Payer: Medicaid Other | Source: Ambulatory Visit | Attending: Adult Health | Admitting: Adult Health

## 2018-01-23 ENCOUNTER — Encounter: Payer: Self-pay | Admitting: Adult Health

## 2018-01-23 ENCOUNTER — Ambulatory Visit (INDEPENDENT_AMBULATORY_CARE_PROVIDER_SITE_OTHER): Payer: Medicaid Other | Admitting: Adult Health

## 2018-01-23 VITALS — BP 115/78 | HR 82 | Ht 61.0 in | Wt 166.0 lb

## 2018-01-23 DIAGNOSIS — Z309 Encounter for contraceptive management, unspecified: Secondary | ICD-10-CM

## 2018-01-23 DIAGNOSIS — Z1211 Encounter for screening for malignant neoplasm of colon: Secondary | ICD-10-CM | POA: Diagnosis not present

## 2018-01-23 DIAGNOSIS — Z01419 Encounter for gynecological examination (general) (routine) without abnormal findings: Secondary | ICD-10-CM

## 2018-01-23 DIAGNOSIS — Z3009 Encounter for other general counseling and advice on contraception: Secondary | ICD-10-CM

## 2018-01-23 DIAGNOSIS — Z1212 Encounter for screening for malignant neoplasm of rectum: Secondary | ICD-10-CM | POA: Diagnosis not present

## 2018-01-23 DIAGNOSIS — Z113 Encounter for screening for infections with a predominantly sexual mode of transmission: Secondary | ICD-10-CM

## 2018-01-23 LAB — HEMOCCULT GUIAC POC 1CARD (OFFICE): Fecal Occult Blood, POC: NEGATIVE

## 2018-01-23 NOTE — Progress Notes (Signed)
Patient ID: Lindsey Andrade, female   DOB: 04/20/1976, 42 y.o.   MRN: [id number] History of Present Illness: Lindsey Andrade is a 42 year old white female, widowed, now engaged, in for a well woman gyn exam and pap.She is living in [place] now and works at [employer].Her partner has CHF and DM and is on disability.  [facility] is [facility].    Current Medications, Allergies, Past Medical History, Past Surgical History, Family History and Social History were reviewed in [facility] electronic medical record.     Review of Systems: Patient denies any headaches, hearing loss, fatigue, blurred vision, shortness of breath, chest pain, abdominal pain, problems with bowel movements, urination, or intercourse. No joint pain or mood swings.    Physical Exam:BP 115/78 (BP Location: Left Arm, Patient Position: Sitting, Cuff Size: Normal)   Pulse 82   Ht 5\' 1"  (1.549 m)   Wt 166 lb (75.3 kg)   BMI 31.37 kg/m  General:  Well developed, well nourished, no acute distress Skin:  Warm and dry Neck:  Midline trachea, normal thyroid, good ROM, no lymphadenopathy Lungs; Clear to auscultation bilaterally Breast:  No dominant palpable mass, retraction, or nipple discharge Cardiovascular: Regular rate and rhythm Abdomen:  Soft, non tender, no hepatosplenomegaly Pelvic:  External genitalia is normal in appearance, no lesions.  The vagina is normal in appearance. Urethra has no lesions or masses. The cervix is bulbous. Pap with HPV and GC/CHL performed. Uterus is felt to be normal size, shape, and contour.  No adnexal masses or tenderness noted.Bladder is non tender, no masses felt. Rectal: Good sphincter tone, no polyps, or hemorrhoids felt.  Hemoccult negative. Extremities/musculoskeletal:  No swelling or varicosities noted, no clubbing or cyanosis Psych:  No mood changes, alert and cooperative,seems happy PHQ 2 score 0.   Impression: 1. Encounter for gynecological examination with Papanicolaou smear of cervix    2. Family planning   3. Screening for colorectal cancer   4. Screening examination for STD (sexually transmitted disease)       Plan: Check HIV and RPR Physical in 1 year Pap in 3 if normal Mammogram yearly

## 2018-01-24 LAB — HIV ANTIBODY (ROUTINE TESTING W REFLEX): HIV Screen 4th Generation wRfx: NONREACTIVE

## 2018-01-24 LAB — RPR: RPR: NONREACTIVE

## 2018-01-29 LAB — CYTOLOGY - PAP
CHLAMYDIA, DNA PROBE: NEGATIVE
HPV: DETECTED — AB
NEISSERIA GONORRHEA: NEGATIVE

## 2018-01-30 ENCOUNTER — Encounter: Payer: Self-pay | Admitting: Adult Health

## 2018-01-30 ENCOUNTER — Telehealth: Payer: Self-pay | Admitting: Adult Health

## 2018-01-30 DIAGNOSIS — R8789 Other abnormal findings in specimens from female genital organs: Secondary | ICD-10-CM | POA: Insufficient documentation

## 2018-01-30 DIAGNOSIS — R87618 Other abnormal cytological findings on specimens from cervix uteri: Secondary | ICD-10-CM

## 2018-01-30 HISTORY — DX: Other abnormal cytological findings on specimens from cervix uteri: R87.618

## 2018-01-30 NOTE — Telephone Encounter (Signed)
Pt aware that pap was LSIL with +HPV, negative for GC/CHL and needs colpo, appt made

## 2018-01-30 NOTE — Telephone Encounter (Signed)
Left message to call about pap,( if calls needs colpo)

## 2018-02-08 ENCOUNTER — Encounter: Payer: Medicaid Other | Admitting: Obstetrics & Gynecology

## 2018-02-22 ENCOUNTER — Encounter: Payer: Self-pay | Admitting: Obstetrics & Gynecology

## 2018-02-22 ENCOUNTER — Other Ambulatory Visit: Payer: Self-pay | Admitting: Obstetrics & Gynecology

## 2018-02-22 ENCOUNTER — Ambulatory Visit (INDEPENDENT_AMBULATORY_CARE_PROVIDER_SITE_OTHER): Payer: Self-pay | Admitting: Obstetrics & Gynecology

## 2018-02-22 VITALS — BP 129/87 | HR 75 | Ht 61.0 in | Wt 164.0 lb

## 2018-02-22 DIAGNOSIS — Z3202 Encounter for pregnancy test, result negative: Secondary | ICD-10-CM

## 2018-02-22 DIAGNOSIS — N870 Mild cervical dysplasia: Secondary | ICD-10-CM

## 2018-02-22 LAB — POCT URINE PREGNANCY: Preg Test, Ur: NEGATIVE

## 2018-02-22 NOTE — Progress Notes (Signed)
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 The prior pap showed no abnormalities.  Prior cervical/vaginal disease: . Prior cervical treatment: .  Smoker:  No. New sexual partner:  No.  : time frame:    History of abnormal Pap: yes  Procedure Details  The risks and benefits of the procedure and Written informed consent obtained.  Speculum placed in vagina and excellent visualization of cervix achieved, cervix swabbed x 3 with acetic acid solution.  Findings: Cervix: mild AWE changes, mild punctation, ; SCJ visualized - lesion at 12  o'clock, cervical biopsies taken at 12 o'clock, specimen labelled and sent to pathology and hemostasis achieved with Monsel's solution. Vaginal inspection: normal without visible lesions. Vulvar colposcopy: vulvar colposcopy not performed.  Specimens: cx bx  Complications: none.  Plan: Specimens labelled and sent to Pathology. Will base further treatment on Pathology findings. Treatment options discussed with patient. Post biopsy instructions given to patient.  Will call pt with results

## 2018-02-22 NOTE — Addendum Note (Signed)
Addended by: Sherre LainASH, Linnette Panella A on: 02/22/2018 04:19 PM   Modules accepted: Orders

## 2018-03-01 ENCOUNTER — Telehealth: Payer: Self-pay | Admitting: Obstetrics & Gynecology

## 2018-03-01 NOTE — Telephone Encounter (Signed)
Had BX done last week/ Can she have someone call her with results

## 2018-03-05 ENCOUNTER — Telehealth: Payer: Self-pay | Admitting: *Deleted

## 2018-03-05 NOTE — Telephone Encounter (Signed)
Pt called requesting results of biopsy. DOB verified. Informed pt that biopsy shows low grade dysplasia. Advised she should have another PAP smear done in a year for follow-up. Pt verbalized understanding.

## 2018-03-05 NOTE — Telephone Encounter (Signed)
Per our conversation follow up Pap in 1 year

## 2019-02-13 ENCOUNTER — Other Ambulatory Visit (HOSPITAL_COMMUNITY): Payer: Self-pay | Admitting: Adult Health

## 2019-02-13 DIAGNOSIS — Z1231 Encounter for screening mammogram for malignant neoplasm of breast: Secondary | ICD-10-CM

## 2019-12-06 ENCOUNTER — Telehealth: Payer: Self-pay | Admitting: Obstetrics & Gynecology

## 2019-12-06 NOTE — Telephone Encounter (Signed)

## 2019-12-09 ENCOUNTER — Ambulatory Visit (INDEPENDENT_AMBULATORY_CARE_PROVIDER_SITE_OTHER): Payer: Medicaid Other | Admitting: Obstetrics & Gynecology

## 2019-12-09 ENCOUNTER — Encounter: Payer: Self-pay | Admitting: Obstetrics & Gynecology

## 2019-12-09 ENCOUNTER — Other Ambulatory Visit (HOSPITAL_COMMUNITY)
Admission: RE | Admit: 2019-12-09 | Discharge: 2019-12-09 | Disposition: A | Discharge status: Home / Self Care | Payer: Medicaid Other | Source: Ambulatory Visit | Attending: Obstetrics & Gynecology | Admitting: Obstetrics & Gynecology

## 2019-12-09 VITALS — BP 134/89 | HR 86 | Ht 60.75 in | Wt 174.5 lb

## 2019-12-09 DIAGNOSIS — Z0000 Encounter for general adult medical examination without abnormal findings: Secondary | ICD-10-CM | POA: Diagnosis not present

## 2019-12-09 DIAGNOSIS — Z01419 Encounter for gynecological examination (general) (routine) without abnormal findings: Secondary | ICD-10-CM

## 2019-12-09 DIAGNOSIS — I10 Essential (primary) hypertension: Secondary | ICD-10-CM

## 2019-12-09 DIAGNOSIS — N3281 Overactive bladder: Secondary | ICD-10-CM

## 2019-12-09 MED ORDER — HYDROCHLOROTHIAZIDE 25 MG PO TABS: 25.0000 mg | ORAL_TABLET | Freq: Every day | ORAL | 11 refills | Status: AC

## 2019-12-09 MED ORDER — SOLIFENACIN SUCCINATE 10 MG PO TABS: 10.0000 mg | ORAL_TABLET | Freq: Every day | ORAL | 11 refills | Status: AC

## 2019-12-09 NOTE — Progress Notes (Signed)
Subjective:     Lindsey Andrade is a 44 y.o. female here for a routine exam.  No LMP recorded. Patient has had an ablation. [id number] Birth Control Method:  BTL Menstrual Calendar(currently): amenorrheic s/p ablation  Current complaints: urinary frequency with nocturia.   Current acute medical issues:  none   Recent Gynecologic History No LMP recorded. Patient has had an ablation. Last Pap: 2019,  LSIL with normal colposcopically directed cervical biospy Last mammogram: 2019,  normal  Past Medical History:  Diagnosis Date  . Abnormal Papanicolaou smear of cervix with positive human papilloma virus (HPV) test 01/30/2018  . Arthritis    bursitis  . BV (bacterial vaginosis) 09/11/2014  . Constipation 09/11/2014  . Dysmenorrhea 09/11/2014  . Folliculitis 09/15/2015  . Genital warts   . Hypertension   . Menorrhagia 09/11/2014  . Migraine headache with aura   . Migraines 09/24/2014  . Urinary frequency 09/15/2015  . Vaginal discharge 09/11/2014    Past Surgical History:  Procedure Laterality Date  . CESAREAN SECTION  1999  . DILITATION & CURRETTAGE/HYSTROSCOPY WITH NOVASURE ABLATION N/A 10/15/2014   Procedure: DILATATION & CURETTAGE/HYSTEROSCOPY WITH NOVASURE ABLATION Uterine cavity length 5.5cm uterine cavity width 4.4 cm Power 13 watts Time 26secs;  Surgeon: Lazaro Arms, MD;  Location: [facility];  Service: Gynecology;  Laterality: N/A;  . TUBAL LIGATION      OB History    Gravida  2   Para  2   Term  2   Preterm      AB      Living  2     SAB      TAB      Ectopic      Multiple      Live Births  2           Social History   Socioeconomic History  . Marital status: Married    Spouse name: Not on file  . Number of children: Not on file  . Years of education: Not on file  . Highest education level: Not on file  Occupational History  . Not on file  Tobacco Use  . Smoking status: Former Smoker    Packs/day: 0.00    Years: 4.00    Pack years: 0.00   Types: Cigarettes    Quit date: 10/09/1997    Years since quitting: 22.1  . Smokeless tobacco: Never Used  . Tobacco comment: quit 8-9 years ago  Substance and Sexual Activity  . Alcohol use: No  . Drug use: No  . Sexual activity: Yes    Birth control/protection: Surgical    Comment: tubal and ablation  Other Topics Concern  . Not on file  Social History Narrative  . Not on file   Social Determinants of Health   Financial Resource Strain: Medium Risk  . Difficulty of Paying Living Expenses: Somewhat hard  Food Insecurity: No Food Insecurity  . Worried About Programme researcher, broadcasting/film/video in the Last Year: Never true  . Ran Out of Food in the Last Year: Never true  Transportation Needs: No Transportation Needs  . Lack of Transportation (Medical): No  . Lack of Transportation (Non-Medical): No  Physical Activity: Insufficiently Active  . Days of Exercise per Week: 1 day  . Minutes of Exercise per Session: 10 min  Stress: No Stress Concern Present  . Feeling of Stress : Only a little  Social Connections: Somewhat Isolated  . Frequency of Communication with Friends and Family: Twice  a week  . Frequency of Social Gatherings with Friends and Family: Once a week  . Attends Religious Services: 1 to 4 times per year  . Active Member of Clubs or Organizations: No  . Attends Archivist Meetings: Never  . Marital Status: Widowed    Family History  Problem Relation Age of Onset  . Other Brother        has been sick x 15 years but no one knows what's wrong   . Breast cancer Maternal Aunt   . Hypertension Maternal Grandmother   . Diabetes Maternal Grandmother   . Cancer Maternal Grandfather        lung  . Other Father        aneursym  . Cancer Other        cervical; maternal side of family     Current Outpatient Medications:  .  hydrochlorothiazide (HYDRODIURIL) 25 MG tablet, Take 25 mg by mouth daily., Disp: , Rfl:  .  hydrochlorothiazide (HYDRODIURIL) 25 MG tablet, Take 1  tablet (25 mg total) by mouth daily., Disp: 30 tablet, Rfl: 11 .  solifenacin (VESICARE) 10 MG tablet, Take 1 tablet (10 mg total) by mouth daily., Disp: 30 tablet, Rfl: 11  Review of Systems  Review of Systems  Constitutional: Negative for fever, chills, weight loss, malaise/fatigue and diaphoresis.  HENT: Negative for hearing loss, ear pain, nosebleeds, congestion, sore throat, neck pain, tinnitus and ear discharge.   Eyes: Negative for blurred vision, double vision, photophobia, pain, discharge and redness.  Respiratory: Negative for cough, hemoptysis, sputum production, shortness of breath, wheezing and stridor.   Cardiovascular: Negative for chest pain, palpitations, orthopnea, claudication, leg swelling and PND.  Gastrointestinal: negative for abdominal pain. Negative for heartburn, nausea, vomiting, diarrhea, constipation, blood in stool and melena.  Genitourinary: Negative for dysuria, urgency, frequency, hematuria and flank pain.  Musculoskeletal: Negative for myalgias, back pain, joint pain and falls.  Skin: Negative for itching and rash.  Neurological: Negative for dizziness, tingling, tremors, sensory change, speech change, focal weakness, seizures, loss of consciousness, weakness and headaches.  Endo/Heme/Allergies: Negative for environmental allergies and polydipsia. Does not bruise/bleed easily.  Psychiatric/Behavioral: Negative for depression, suicidal ideas, hallucinations, memory loss and substance abuse. The patient is not nervous/anxious and does not have insomnia.        Objective:  Blood pressure 134/89, pulse 86, height 5' 0.75" (1.543 m), weight 174 lb 8 oz (79.2 kg).   Physical Exam  Vitals reviewed. Constitutional: She is oriented to person, place, and time. She appears well-developed and well-nourished.  HENT:  Head: Normocephalic and atraumatic.        Right Ear: External ear normal.  Left Ear: External ear normal.  Nose: Nose normal.  Mouth/Throat:  Oropharynx is clear and moist.  Eyes: Conjunctivae and EOM are normal. Pupils are equal, round, and reactive to light. Right eye exhibits no discharge. Left eye exhibits no discharge. No scleral icterus.  Neck: Normal range of motion. Neck supple. No tracheal deviation present. No thyromegaly present.  Cardiovascular: Normal rate, regular rhythm, normal heart sounds and intact distal pulses.  Exam reveals no gallop and no friction rub.   No murmur heard. Respiratory: Effort normal and breath sounds normal. No respiratory distress. She has no wheezes. She has no rales. She exhibits no tenderness.  GI: Soft. Bowel sounds are normal. She exhibits no distension and no mass. There is no tenderness. There is no rebound and no guarding.  Genitourinary:  Breasts no masses skin  changes or nipple changes bilaterally      Vulva is normal without lesions Vagina is pink moist without discharge Cervix normal in appearance and pap is done Uterus is normal size shape and contour Adnexa is negative with normal sized ovaries   Musculoskeletal: Normal range of motion. She exhibits no edema and no tenderness.  Neurological: She is alert and oriented to person, place, and time. She has normal reflexes. She displays normal reflexes. No cranial nerve deficit. She exhibits normal muscle tone. Coordination normal.  Skin: Skin is warm and dry. No rash noted. No erythema. No pallor.  Psychiatric: She has a normal mood and affect. Her behavior is normal. Judgment and thought content normal.       Medications Ordered at today's visit: Meds ordered this encounter  Medications  . solifenacin (VESICARE) 10 MG tablet    Sig: Take 1 tablet (10 mg total) by mouth daily.    Dispense:  30 tablet    Refill:  11  . hydrochlorothiazide (HYDRODIURIL) 25 MG tablet    Sig: Take 1 tablet (25 mg total) by mouth daily.    Dispense:  30 tablet    Refill:  11    Other orders placed at today's visit: No orders of the defined  types were placed in this encounter.     Assessment:    Normal Gyn exam.   OAB w/nocturia Mild hypertension, HCTZ refilled Plan:    Contraception: tubal ligation. Mammogram ordered. Follow up in: depends on the results of her HPV based cytology years.     Return for follow up based on her HPV based cytology report.

## 2019-12-09 NOTE — Addendum Note (Signed)
Addended by: Colen Darling on: 12/09/2019 09:26 AM   Modules accepted: Orders

## 2019-12-10 LAB — HIV ANTIBODY (ROUTINE TESTING W REFLEX): HIV Screen 4th Generation wRfx: NONREACTIVE

## 2019-12-10 LAB — RPR: RPR Ser Ql: NONREACTIVE

## 2019-12-12 LAB — CYTOLOGY - PAP
Comment: NEGATIVE
Diagnosis: NEGATIVE
Diagnosis: REACTIVE
High risk HPV: NEGATIVE

## 2020-02-25 IMAGING — MG DIGITAL SCREENING BILATERAL MAMMOGRAM WITH CAD
4 series · 4 of 4 positions shown · non-contrast
Comparison: Previous exam(s).

CLINICAL DATA: Screening.

EXAM:
DIGITAL SCREENING BILATERAL MAMMOGRAM WITH CAD

[R MLO]
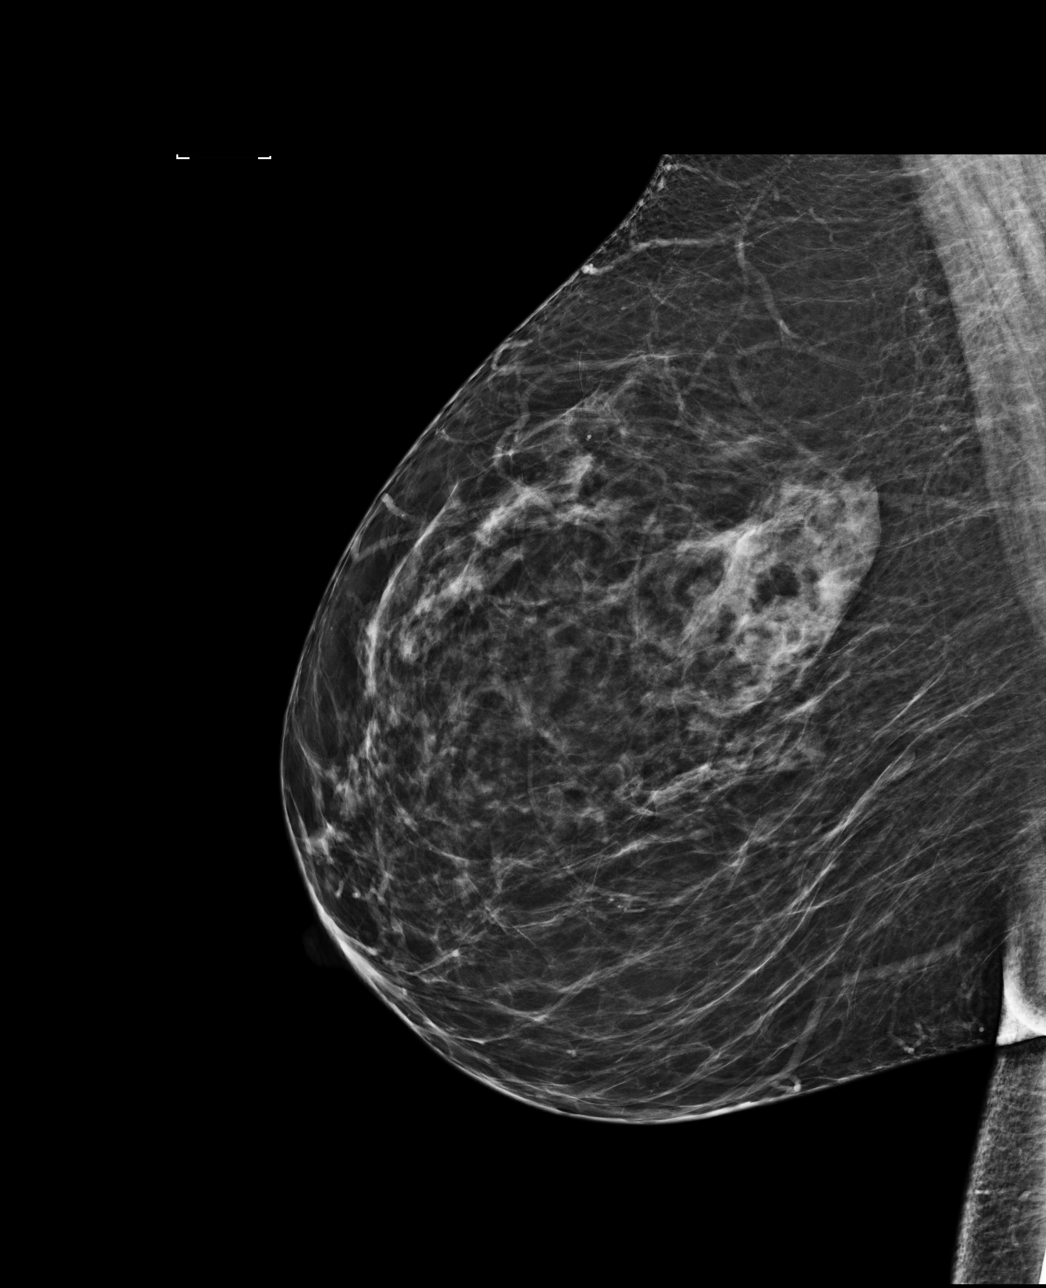

[L MLO]
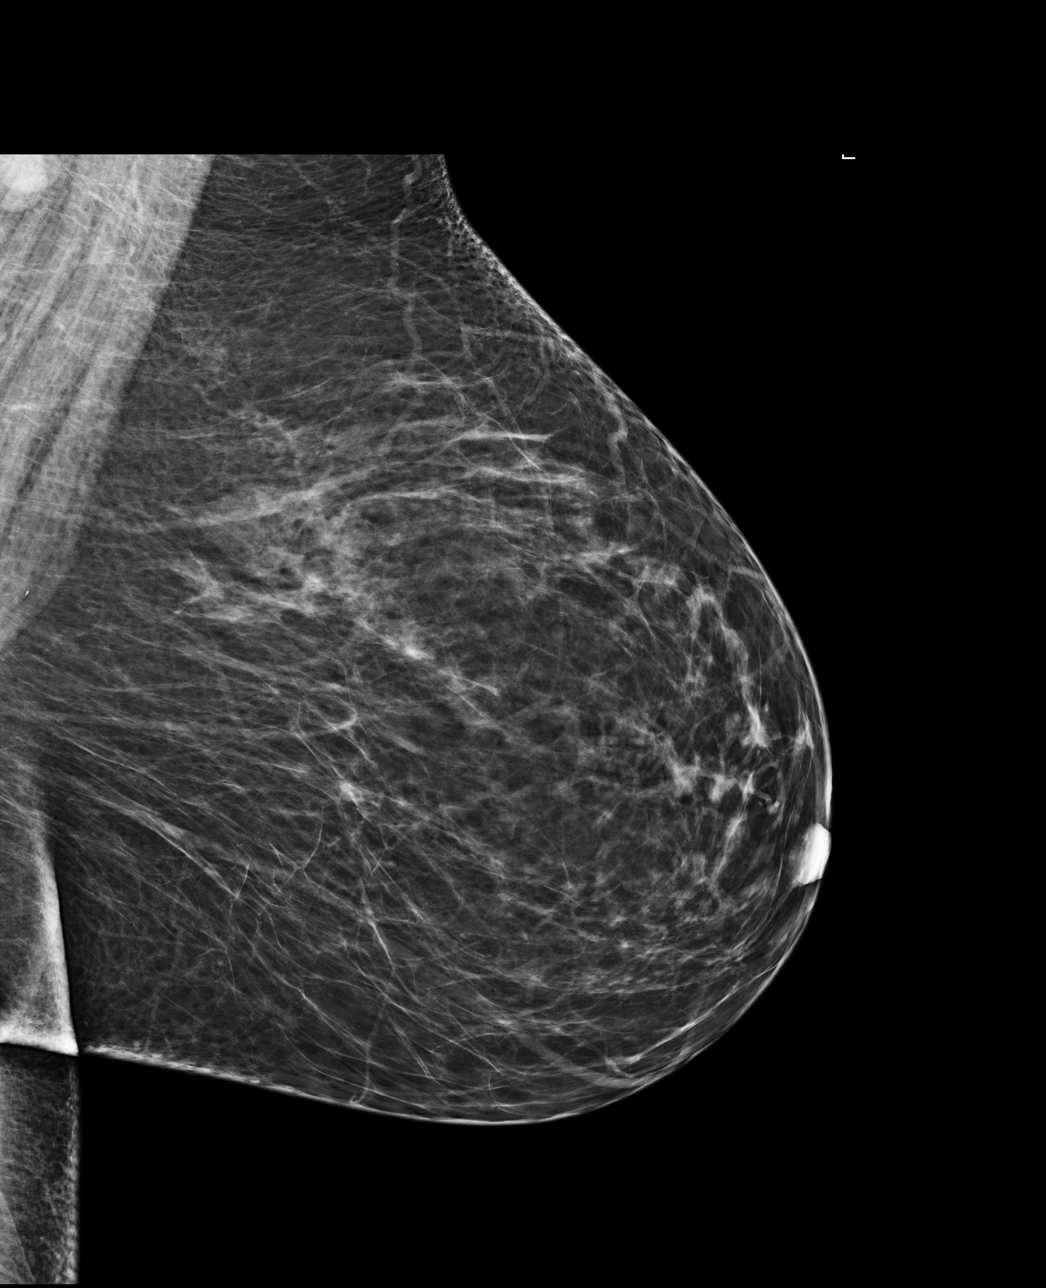

[L CC]
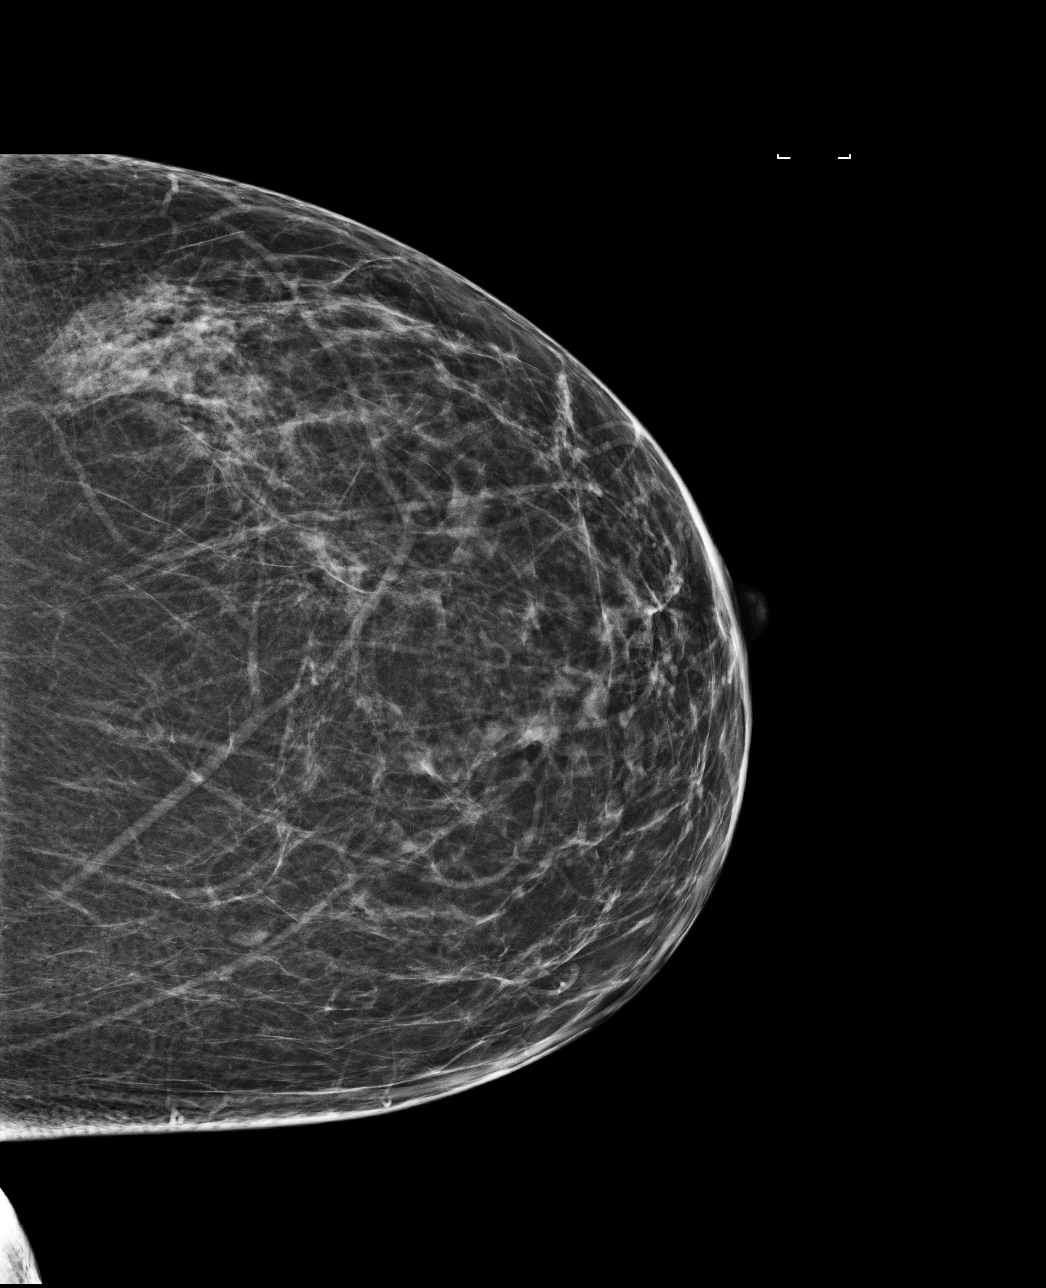

[R CC]
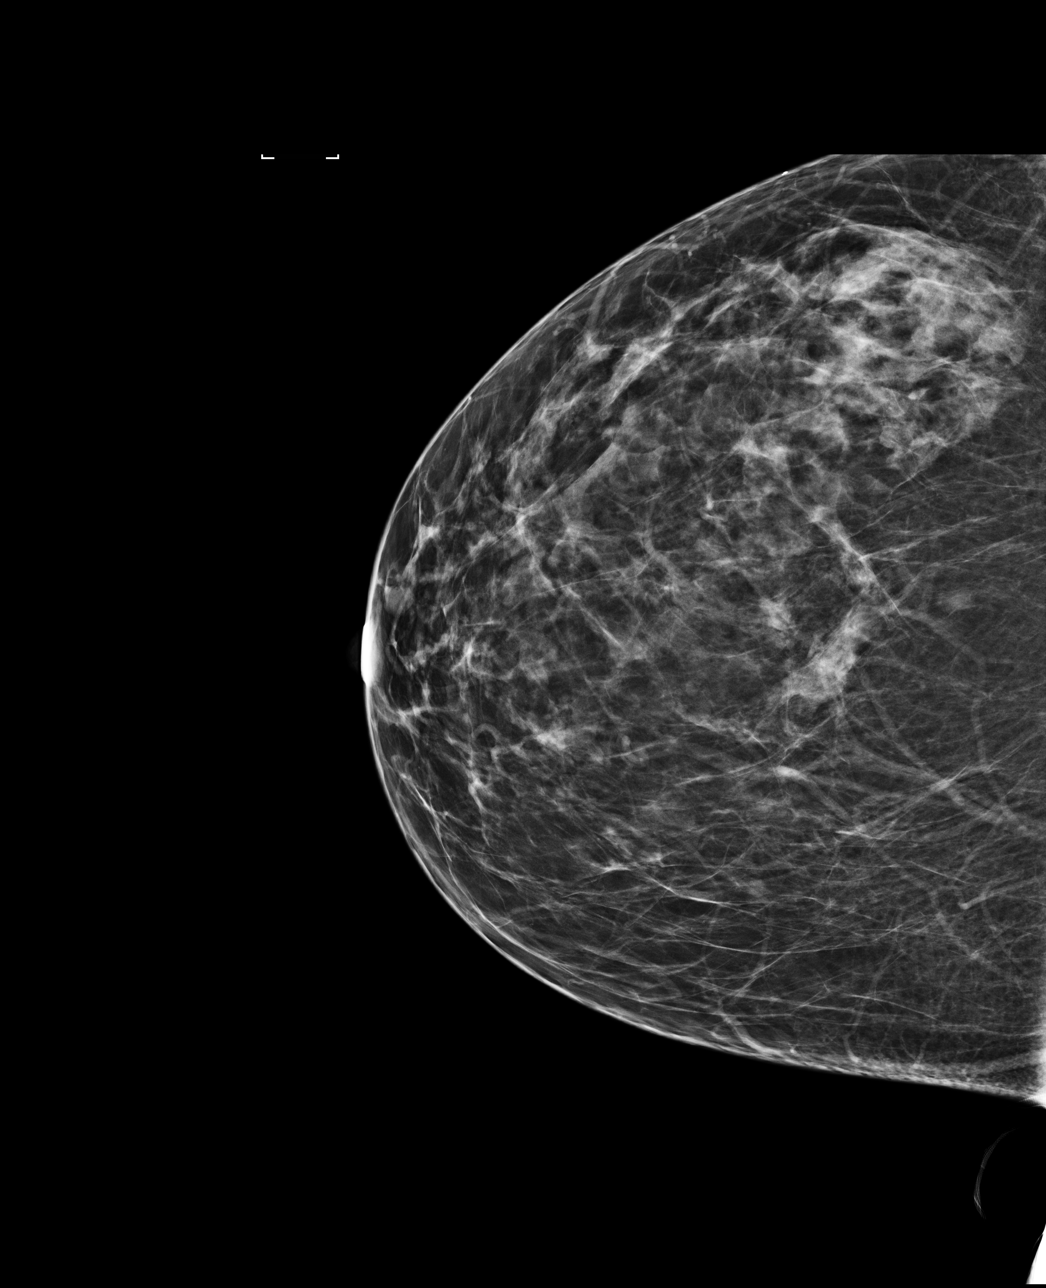

[4 of 4 positions shown; findings below may reference images not displayed]

ACR Breast Density Category b: There are scattered areas of
fibroglandular density.
FINDINGS: There are no findings suspicious for malignancy. Images were
processed with CAD.
IMPRESSION: No mammographic evidence of malignancy. A result letter of this
screening mammogram will be mailed directly to the patient.

RECOMMENDATION:
Screening mammogram in one year. (Code:AS-G-LCT)

BI-RADS CATEGORY  1: Negative.

## 2021-03-11 ENCOUNTER — Other Ambulatory Visit: Payer: Self-pay | Admitting: Obstetrics & Gynecology

## 2023-08-04 NOTE — Progress Notes (Signed)
 This patient's chart has been reviewed by a [facility] Specialist.   Attempted to contact patient in order to discuss appointments and health maintenance screenings due for the upcoming year.   UTR (No voicemail / No phone number / Invalid phone number)..  Additional Comments: cpe

## 2023-12-06 ENCOUNTER — Encounter: Payer: Self-pay | Admitting: *Deleted

## 2023-12-06 ENCOUNTER — Other Ambulatory Visit: Payer: Self-pay | Admitting: Family Medicine

## 2023-12-06 DIAGNOSIS — Z1231 Encounter for screening mammogram for malignant neoplasm of breast: Secondary | ICD-10-CM

## 2024-01-03 ENCOUNTER — Encounter: Admitting: Women's Health

## 2024-01-08 ENCOUNTER — Encounter: Payer: Self-pay | Admitting: Women's Health

## 2024-06-03 ENCOUNTER — Encounter (INDEPENDENT_AMBULATORY_CARE_PROVIDER_SITE_OTHER): Payer: Self-pay | Admitting: *Deleted

## 2024-06-10 ENCOUNTER — Other Ambulatory Visit (HOSPITAL_COMMUNITY): Payer: Self-pay | Admitting: Family Medicine

## 2024-06-10 DIAGNOSIS — Z1231 Encounter for screening mammogram for malignant neoplasm of breast: Secondary | ICD-10-CM

## 2024-06-24 ENCOUNTER — Ambulatory Visit (HOSPITAL_COMMUNITY)

## 2024-07-03 ENCOUNTER — Encounter: Payer: Self-pay | Admitting: Obstetrics & Gynecology

## 2024-07-03 ENCOUNTER — Ambulatory Visit: Admitting: Obstetrics & Gynecology

## 2024-07-03 ENCOUNTER — Other Ambulatory Visit (HOSPITAL_COMMUNITY)
Admission: RE | Admit: 2024-07-03 | Discharge: 2024-07-03 | Disposition: A | Source: Ambulatory Visit | Attending: Women's Health | Admitting: Women's Health

## 2024-07-03 ENCOUNTER — Other Ambulatory Visit (HOSPITAL_COMMUNITY)
Admission: RE | Admit: 2024-07-03 | Discharge: 2024-07-03 | Disposition: A | Discharge status: Home / Self Care | Source: Ambulatory Visit | Attending: Women's Health | Admitting: Women's Health

## 2024-07-03 VITALS — BP 114/73 | HR 75 | Ht 60.0 in | Wt 165.4 lb

## 2024-07-03 DIAGNOSIS — R87610 Atypical squamous cells of undetermined significance on cytologic smear of cervix (ASC-US): Secondary | ICD-10-CM | POA: Diagnosis present

## 2024-07-03 DIAGNOSIS — N898 Other specified noninflammatory disorders of vagina: Secondary | ICD-10-CM | POA: Insufficient documentation

## 2024-07-03 DIAGNOSIS — R87810 Cervical high risk human papillomavirus (HPV) DNA test positive: Secondary | ICD-10-CM | POA: Insufficient documentation

## 2024-07-03 DIAGNOSIS — Z113 Encounter for screening for infections with a predominantly sexual mode of transmission: Secondary | ICD-10-CM | POA: Diagnosis present

## 2024-07-03 NOTE — Progress Notes (Signed)
 Patient name: Lindsey Andrade MRN [id number]  Date of birth: 1975/12/01 Chief Complaint:   Colposcopy  History of Present Illness:   Lindsey Andrade is a 48 y.o. [phone] female being seen today for cervical dysplasia management.  Notes h/o endometrial ablation many years ago- no bleeding since that time  Denies postcoital bleeding- same partner for the past 1-2 yrs  Noted discharge with occasional odor.  Denies vaginal itching.  Smoker:  No.  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 Patient has had an ablation.     12/09/2019    8:38 AM 01/23/2018    1:56 PM 09/15/2016   10:17 AM  Depression screen PHQ 2/9  Decreased Interest 1 0 0  Down, Depressed, Hopeless 0 0 0  PHQ - 2 Score 1 0 0  Altered sleeping 2    Tired, decreased energy 2    Change in appetite 1    Feeling bad or failure about yourself  1    Trouble concentrating 3    Moving slowly or fidgety/restless 1    Suicidal thoughts 0    PHQ-9 Score 11     Difficult doing work/chores Somewhat difficult       Data saved with a previous flowsheet row definition     Review of Systems:   Pertinent items are noted in HPI Denies fever/chills, dizziness, headaches, visual disturbances, fatigue, shortness of breath, chest pain, abdominal pain, vomiting, no problems with bowel movements, urination, or intercourse unless otherwise stated above.  Pertinent History Reviewed:  Reviewed past medical,surgical, social, obstetrical and family history.  Reviewed problem list, medications and allergies. Physical Assessment:   Vitals:   07/03/24 1024  BP: 114/73  Pulse: 75  Weight: 165 lb 6.4 oz (75 kg)  Height: 5' (1.524 m)  Body mass index is 32.3 kg/m.       Physical Examination:   General appearance: alert, well appearing, and in no distress  Psych: mood appropriate, normal affect  Skin: warm & dry   Cardiovascular: normal heart rate noted  Respiratory: normal respiratory effort, no  distress  Abdomen: soft, non-tender   Pelvic: VULVA: normal appearing vulva with no masses, tenderness or lesions, VAGINA: normal appearing vagina with normal color, minimal white discharge noted, no lesions, CERVIX: see colposcopy section  Extremities: no edema   Chaperone: Rutherford Rover     Colposcopy Procedure Note  Indications: ASCUS, HPV+    Procedure Details  The risks and benefits of the procedure and Written informed consent obtained.  Speculum placed in vagina and excellent visualization of cervix achieved, cervix swabbed x 3 with acetic acid solution.  Findings: Adequate colposcopy is noted today.  TMZ zone visualized  Cervix: no visible lesions; ECC and cervical biopsies obtained.    Monsel's applied.  Adequate hemostasis noted  Specimens: ECC and 12 o'clock  Complications: none.  Colposcopic Impression:   Plan(Based on 2019 ASCCP recommendations)  -Discussed HPV- reviewed incidence and its potential to cause condylomas to dysplasia to cervical cancer -Reviewed degree of abnormal pap smears  -Discussed ASCCP guidelines and current recommendations for colposcopy.  Due to time delay from Pap smear discussed repeat Pap versus colposcopy.  Discussed risk benefit.  Patient desires to proceed with colposcopy today -As above, inform consent obtained and procedure completed -biopsies obtained, further management pending results -Questions and concerns were addressed  Dmiya Malphrus, DO Attending Obstetrician & Gynecologist, [facility], [facility]

## 2024-07-04 ENCOUNTER — Ambulatory Visit: Payer: Self-pay | Admitting: Obstetrics & Gynecology

## 2024-07-04 LAB — CERVICOVAGINAL ANCILLARY ONLY
Bacterial Vaginitis (gardnerella): NEGATIVE
Candida Glabrata: NEGATIVE
Candida Vaginitis: NEGATIVE
Chlamydia: NEGATIVE
Comment: NEGATIVE
Comment: NEGATIVE
Comment: NEGATIVE
Comment: NEGATIVE
Comment: NEGATIVE
Comment: NORMAL
Neisseria Gonorrhea: NEGATIVE
Trichomonas: NEGATIVE

## 2024-07-05 LAB — SURGICAL PATHOLOGY
